# Patient Record
Sex: Male | Born: 1980 | Race: White | Hispanic: No | Marital: Single | Smoking: Never smoker
Health system: Southern US, Academic
[De-identification: ages and names within clinical notes are randomized; demographics above are authoritative.]

## PROBLEM LIST (undated history)

## (undated) DIAGNOSIS — F308 Other manic episodes: Secondary | ICD-10-CM

## (undated) DIAGNOSIS — I10 Essential (primary) hypertension: Secondary | ICD-10-CM

## (undated) DIAGNOSIS — F419 Anxiety disorder, unspecified: Secondary | ICD-10-CM

## (undated) DIAGNOSIS — F32A Depression, unspecified: Secondary | ICD-10-CM

---

## 2022-02-12 ENCOUNTER — Emergency Department
Admission: EM | Admit: 2022-02-12 | Discharge: 2022-02-12 | Disposition: A | Discharge status: Home / Self Care | Payer: No Typology Code available for payment source | Attending: Student in an Organized Health Care Education/Training Program | Admitting: Student in an Organized Health Care Education/Training Program

## 2022-02-12 ENCOUNTER — Encounter (HOSPITAL_COMMUNITY): Payer: Self-pay

## 2022-02-12 ENCOUNTER — Other Ambulatory Visit: Payer: Self-pay

## 2022-02-12 DIAGNOSIS — Y92512 Supermarket, store or market as the place of occurrence of the external cause: Secondary | ICD-10-CM | POA: Insufficient documentation

## 2022-02-12 DIAGNOSIS — M545 Low back pain, unspecified: Secondary | ICD-10-CM | POA: Insufficient documentation

## 2022-02-12 DIAGNOSIS — X500XXA Overexertion from strenuous movement or load, initial encounter: Secondary | ICD-10-CM | POA: Insufficient documentation

## 2022-02-12 HISTORY — DX: Essential (primary) hypertension: I10

## 2022-02-12 HISTORY — DX: Anxiety disorder, unspecified: F41.9

## 2022-02-12 MED ORDER — LIDOCAINE 4 % TOPICAL PATCH
1.0000 | MEDICATED_PATCH | Freq: Every day | CUTANEOUS | 0 refills | Status: AC
Start: 2022-02-12 — End: 2022-02-19

## 2022-02-12 NOTE — ED Provider Notes (Signed)
[facility]  ED Primary Provider Note  History of Present Illness   Chief Complaint   Patient presents with    Back Injury     Cody Mccoy is a 41 y.o. male who had concerns including Back Injury.  Arrival: The patient arrived by Car    Patient is a 41 year old male arrives today via POV with mom at bedside complaining of lower back pain secondary to lifting bottle water at work.  Patient reports he works at [employer] on 02/04/2022 he was lifting a case of bottled water and when he picked up he twist and felt a "snap pop".  In his back.  Patient reports pain has worsened since initial incident.  Patient states he is risk x-rays that showed no acute findings.  Patient was recently started on prednisone a day and a half ago per patient patient states he is tried ibuprofen with no relief.  Patient states heating pad makes it.  Patient states [facility] symptom to the emergency department for MRI.  Patient does have a active referral at this time for physical therapy.  Patient has not follow up with his PCP since initial incident.  Patient is ambulatory denies saddle anesthesia numbness tingling lower extremities urinary incontinence urinary retention.      History Reviewed This Encounter:      Physical Exam   ED Triage Vitals   BP (Non-Invasive) 02/12/22 1015 (!) 170/84   Heart Rate 02/12/22 1015 86   Respiratory Rate 02/12/22 1015 20   Temperature 02/12/22 1241 36.8 C (98.2 F)   SpO2 02/12/22 1015 100 %   Weight 02/12/22 1015 112 kg (247 lb)   Height 02/12/22 1015 1.803 m (5\' 11" )     Physical Exam  Constitutional:       Appearance: Normal appearance. He is obese.   HENT:      Head: Normocephalic and atraumatic.   Cardiovascular:      Rate and Rhythm: Normal rate and regular rhythm.      Pulses: Normal pulses.      Heart sounds: Normal heart sounds.   Pulmonary:      Effort: Pulmonary effort is normal.   Abdominal:      General: Abdomen is flat.      Palpations: Abdomen is soft.    Musculoskeletal:         General: Normal range of motion.      Cervical back: Normal.      Thoracic back: Normal.      Lumbar back: Spasms and tenderness present. No swelling, edema, deformity, signs of trauma, lacerations or bony tenderness. Normal range of motion. Negative right straight leg raise test and negative left straight leg raise test. No scoliosis.   Skin:     General: Skin is warm.      Capillary Refill: Capillary refill takes less than 2 seconds.   Neurological:      General: No focal deficit present.      Mental Status: He is alert and oriented to person, place, and time. Mental status is at baseline.      Motor: No weakness.      Coordination: Coordination normal.      Gait: Gait normal.   Psychiatric:         Mood and Affect: Mood normal.         Behavior: Behavior normal.         Thought Content: Thought content normal.  Judgment: Judgment normal.       Patient Data   Labs Ordered/Reviewed - No data to display  No orders to display     Medical Decision Making        Medical Decision Making  No repeat x-rays were obtained since patient does have x-rays previously done.  Patient does have referral for physical therapy at this time.  Patient is on prednisone currently.  Prescription was given for lidocaine patches for patient patient was advised to continue with referral to physical therapy and following up with primary care provider further evaluation.  Patient advised symptomatic treatment at home.  All questions and concerns addressed.  Strict ED return precautions were given.  Patient was in agreement to the treatment care provided today.  Patient ambulatory with no change in gait upon discharge.                Clinical Impression   Lower back pain (Primary)       Disposition: Discharged

## 2022-02-12 NOTE — ED Triage Notes (Signed)
Pt presents with back injury from work. Picking up a case of water and heard "snap pop".

## 2022-02-12 NOTE — Discharge Instructions (Signed)
Follow up with your primary care provider for close ED follow-up.  Follow up with your referral for physical therapy.  Take medication the prednisone as previously prescribed.  Wear lidocaine patches as indicated as needed for pain management.  Take Tylenol and/or Motrin as needed for pain.  Biofreeze icy hot or Ben-Gay may provide relief.  Return emergency department for new or worsening symptoms that concern you.

## 2022-02-12 NOTE — ED Nurses Note (Signed)
Patient discharged home with family.  AVS reviewed with patient/care giver.  A written copy of the AVS and discharge instructions was given to the patient/care giver.  Questions sufficiently answered as needed.  Patient/care giver encouraged to follow up with PCP as indicated.  In the event of an emergency, patient/care giver instructed to call 911 or go to the nearest emergency room.

## 2022-03-01 IMAGING — MR MRI LUMBAR SPINE WITHOUT CONTRAST
5 of 6 series · 32 of 48 positions shown · IV contrast (gadolinium)
Comparison: None available.

﻿EXAM:  92262   MRI LUMBAR SPINE WITHOUT CONTRAST
INDICATION: 41-year-old male with history of persistent low back pain. Patient sustained injury lifting heavy object in September. Pain radiating to left hip and lower extremity. No history of back surgery.
TECHNIQUE: Multiplanar, multisequential MRI of the lumbosacral spine was performed without gadolinium contrast.

[Series 5: T2 · sagittal · 4.0mm · 0.94mm/px · 6 of 13 slices shown (1 of 3)]
[im 1/13]
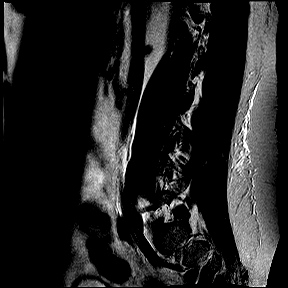
[im 3/13]
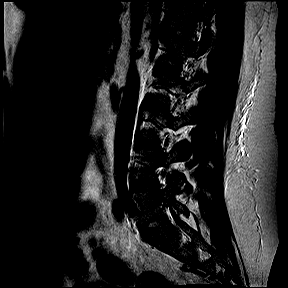
[im 5/13]
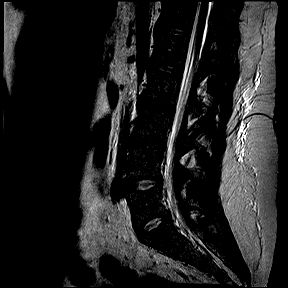
[im 8/13]
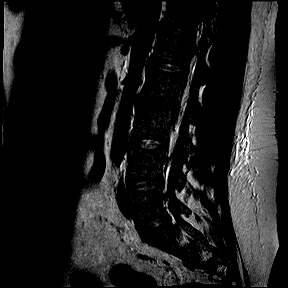
[im 10/13]
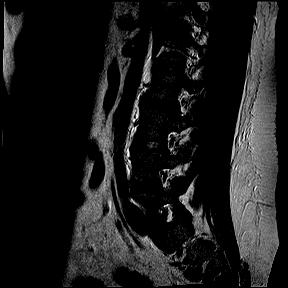
[im 13/13]
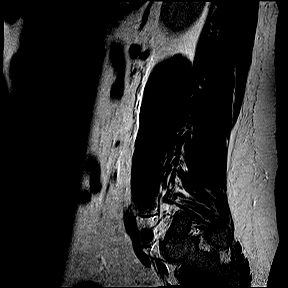

[Series 6: T1 · sagittal · 4.0mm · 0.94mm/px · 6 of 13 slices shown (1 of 2)]
[im 1/13]
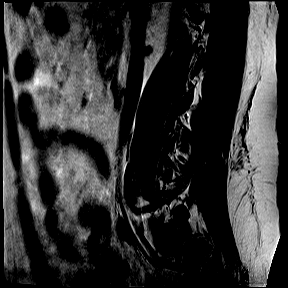
[im 3/13]
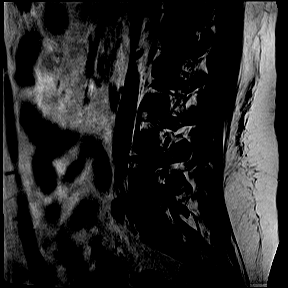
[im 5/13]
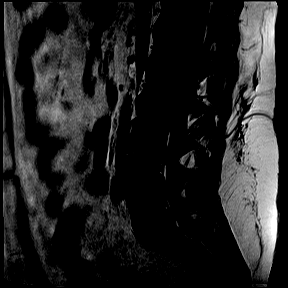
[im 8/13]
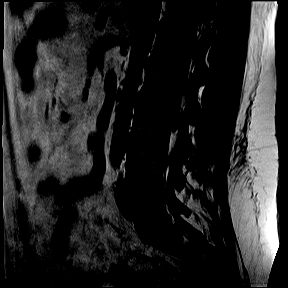
[im 10/13]
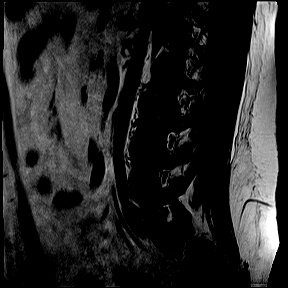
[im 13/13]
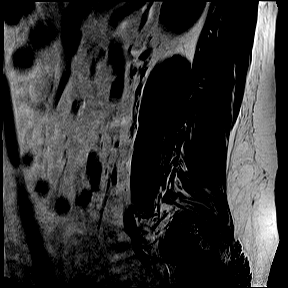

[Series 8: T2 · coronal · 5.0mm · 0.82mm/px · 8 of 18 slices shown (2 of 3)]
[im 1/18]
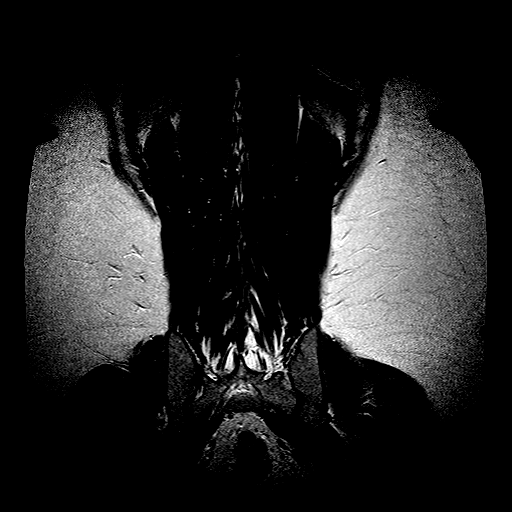
[im 3/18]
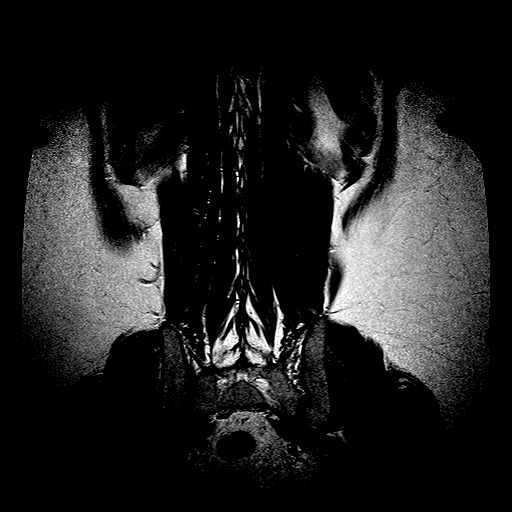
[im 5/18]
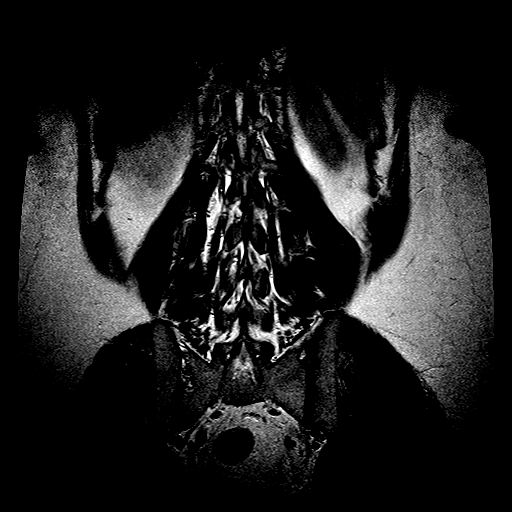
[im 8/18]
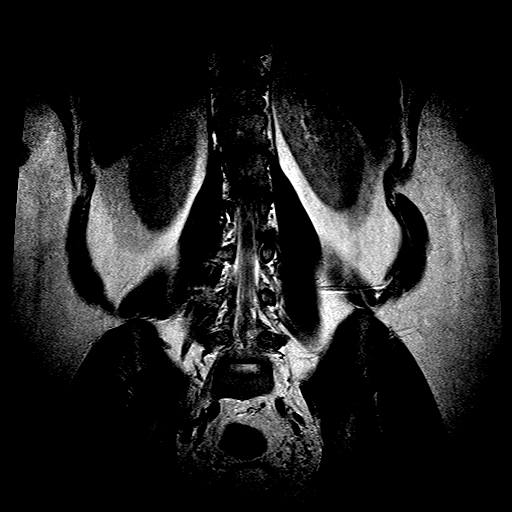
[im 10/18]
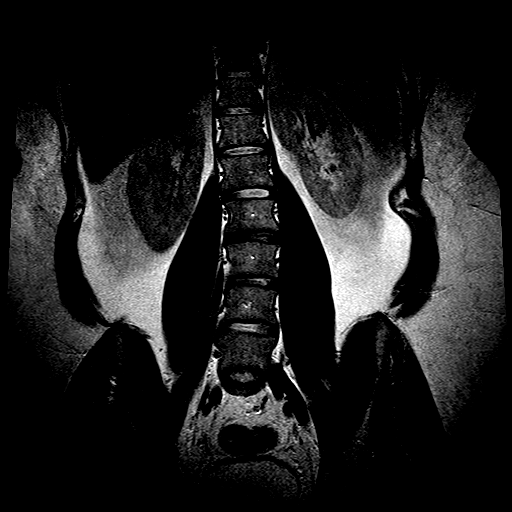
[im 13/18]
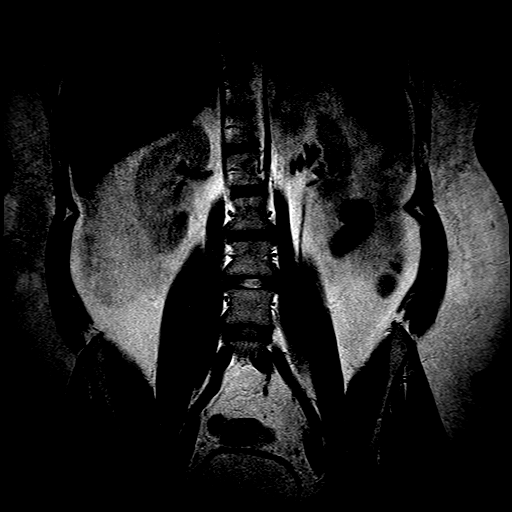
[im 15/18]
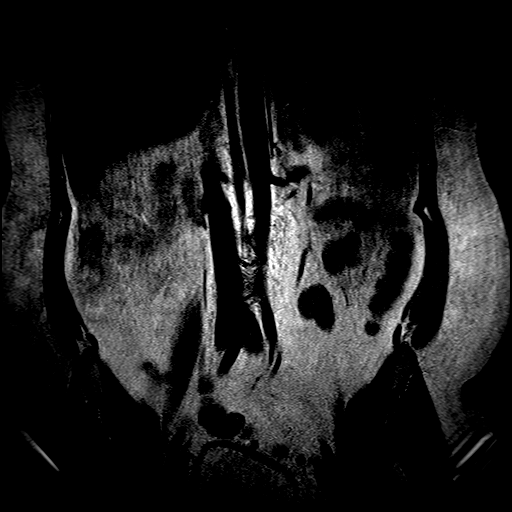
[im 18/18]
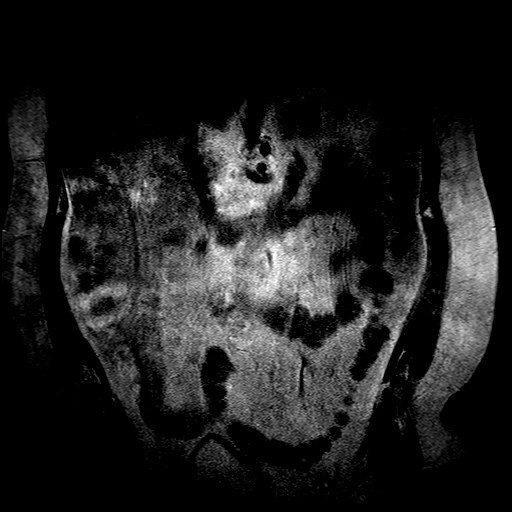

[Series 9: T2 · axial · 4.0mm · 0.52mm/px · z∈[-139,+78]mm · 11 of 23 slices shown (3 of 3)]
[im 1/23]
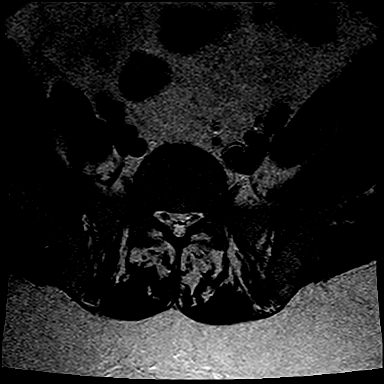
[im 3/23]
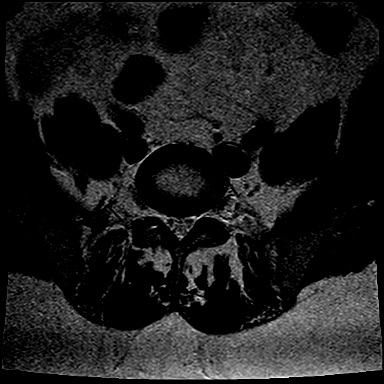
[im 5/23]
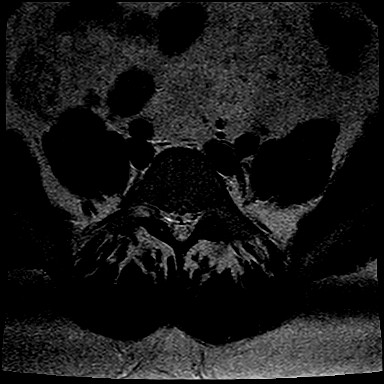
[im 7/23]
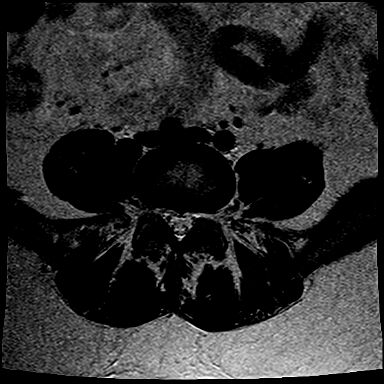
[im 9/23]
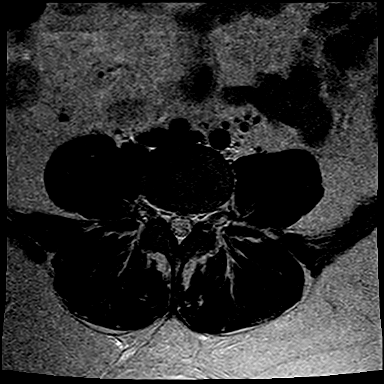
[im 12/23]
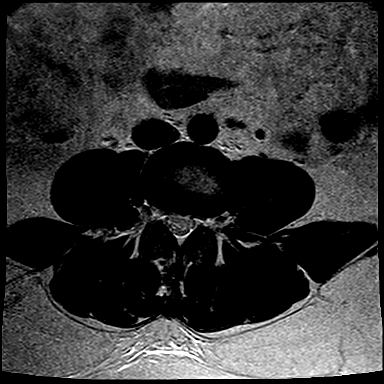
[im 14/23]
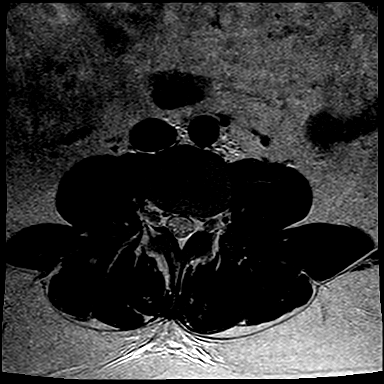
[im 16/23]
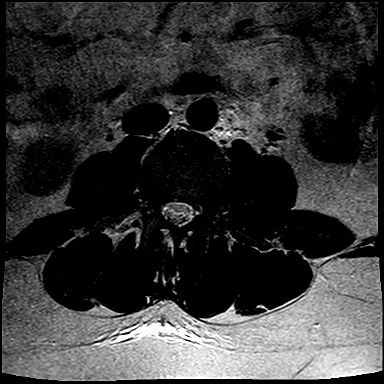
[im 18/23]
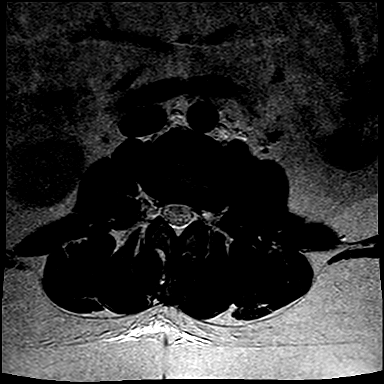
[im 20/23]
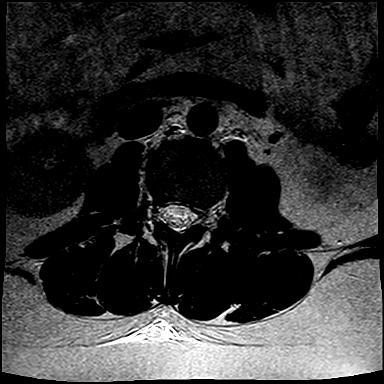
[im 23/23]
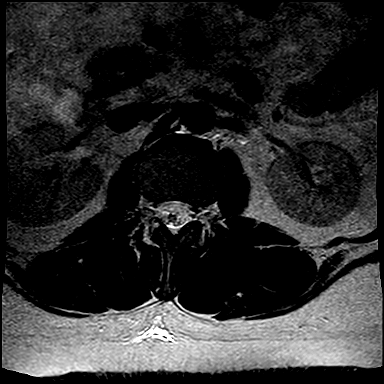

[Series 10: T1 · axial · 4.0mm · 0.52mm/px · 1 of 23 slices shown (2 of 2)]
[im 1/23]
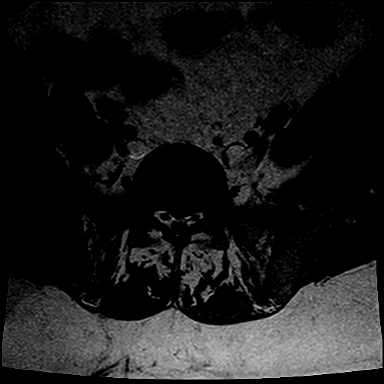

[32 of 48 positions shown; findings below may reference images not displayed]

FINDINGS: No acute or focal bone changes of lumbar vertebrae are seen. 

Lower spinal cord and cauda equina are normal.

At L1-2 level, no focal disc lesions are seen. 

At L2-3 level, mild degenerative disc changes are noted with bulging annulus and facet arthropathy causing mild compromise of left neural foramen.

At L3-4 level, no focal disc lesions are seen. 

At L4-5 level, bilateral facet arthropathy is causing moderate compromise of both lateral recesses.

At L5-S1 level, bilateral facet arthropathy is causing moderate compromise of left neural foramen. 

Paravertebral soft tissues are unremarkable.
IMPRESSION: 1. No focal or acute bone changes of lumbar vertebrae.

2. At L4-5 level, bilateral facet arthropathy is causing moderate compromise of both lateral recesses.

3. At L5-S1 level, bilateral facet arthropathy is causing moderate compromise of left neural foramen. 

4. Findings at other disc levels are described above in detail.

5. Overall, findings described above are chronic in nature.

## 2022-09-02 IMAGING — MR MRI CERVICAL SPINE WITHOUT CONTRAST
4 of 5 series · 23 of 48 positions shown · non-contrast
Comparison: None previous.

﻿EXAM:  [id number]   MRI CERVICAL SPINE WITHOUT CONTRAST
INDICATION: 42-year-old with neck pain radiating to both shoulders.  Sustained lifting injury lifting heavy object.  No history of cervical spine surgery.
TECHNIQUE: Coronal, sagittal and axial images as per protocol.  Some of the images in the upper cervical spine area were compromised due to artifacts produced by metallic object in the dental work.  Overall quality is acceptable.

[Series 5: T2 · sagittal · 3.0mm · 0.75mm/px · 8 of 13 slices shown (1 of 2)]
[im 1/13]
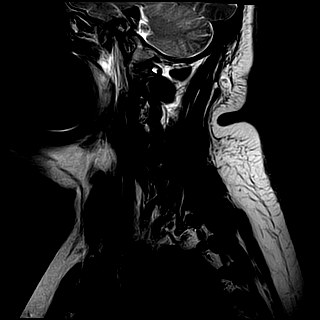
[im 2/13]
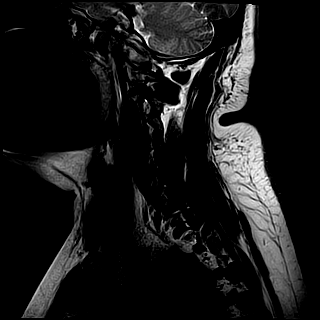
[im 4/13]
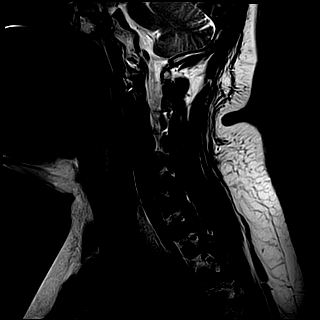
[im 6/13]
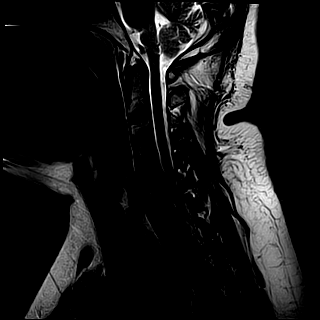
[im 7/13]
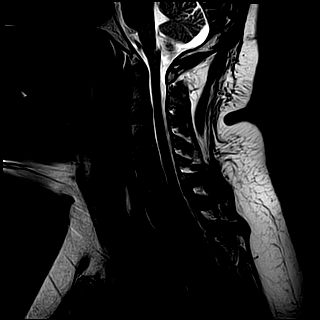
[im 9/13]
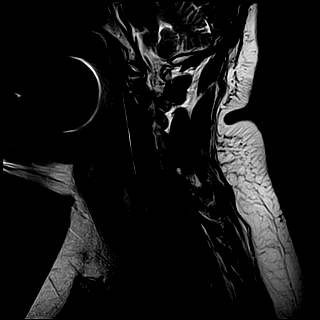
[im 11/13]
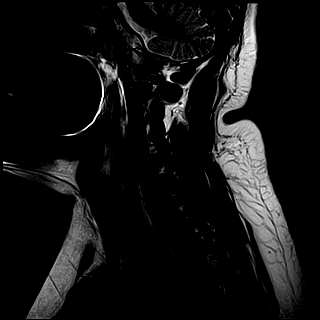
[im 13/13]
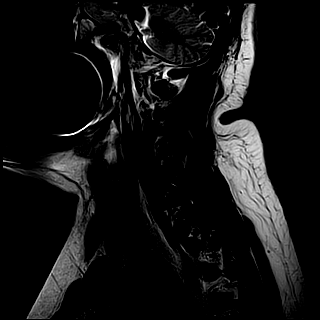

[Series 6: T1 · sagittal · 3.0mm · 0.47mm/px · 3 of 13 slices shown]
[im 2/13]
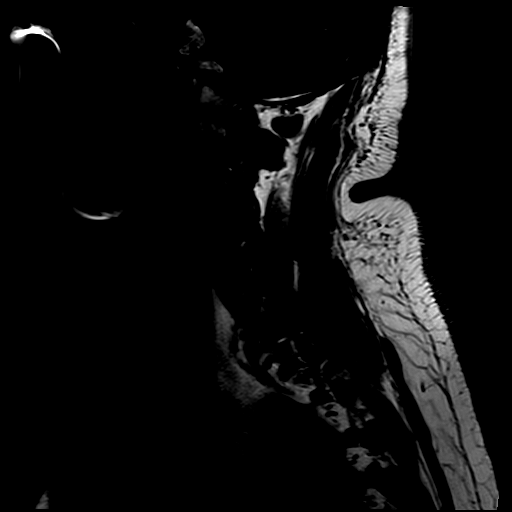
[im 7/13]
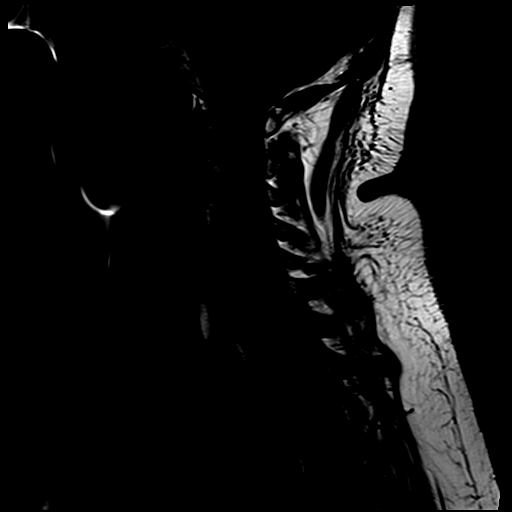
[im 11/13]
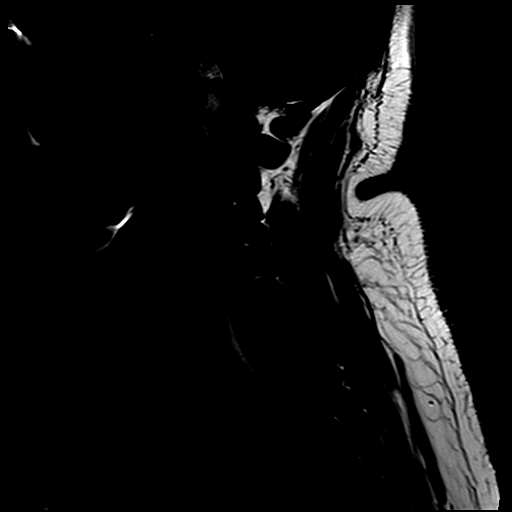

[Series 7: STIR · sagittal · 3.0mm · 0.47mm/px · 3 of 13 slices shown]
[im 2/13]
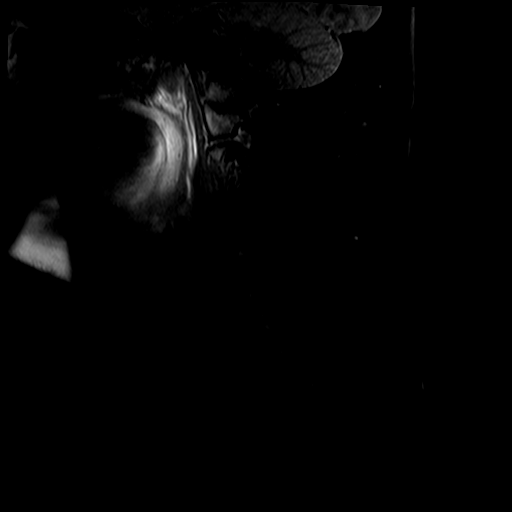
[im 7/13]
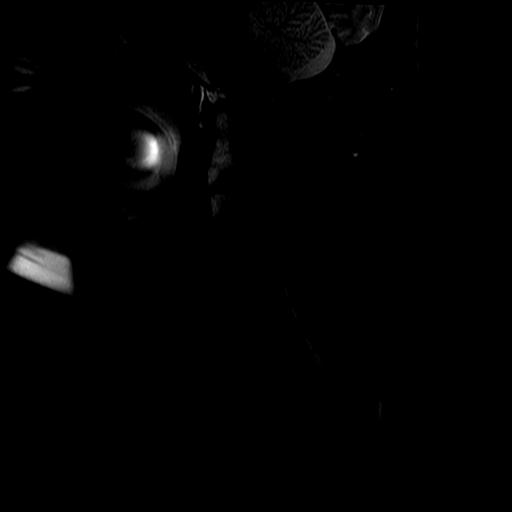
[im 11/13]
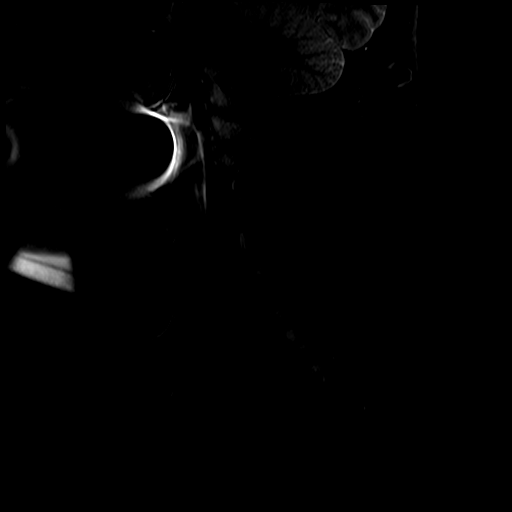

[Series 8: T2 · axial · 3.0mm · 0.39mm/px · z∈[-74,+24]mm · 9 of 18 slices shown (2 of 2)]
[im 1/18]
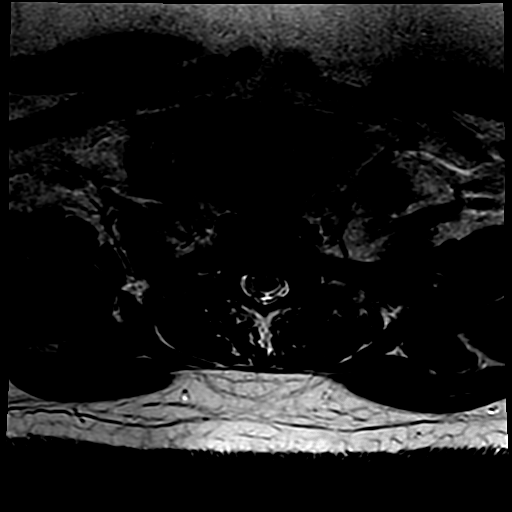
[im 4/18]
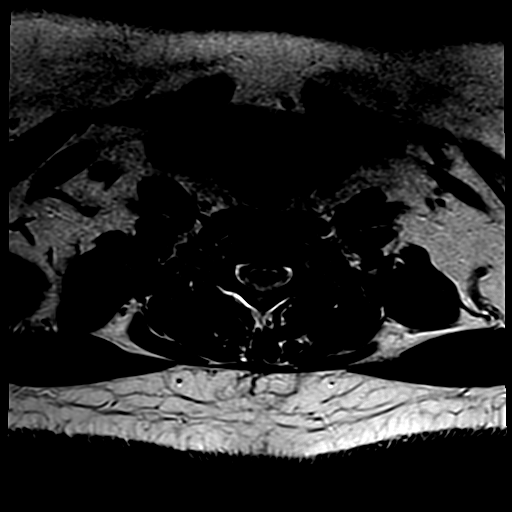
[im 5/18]
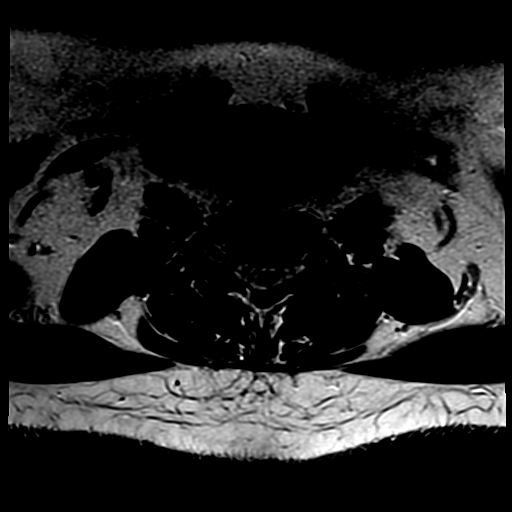
[im 8/18]
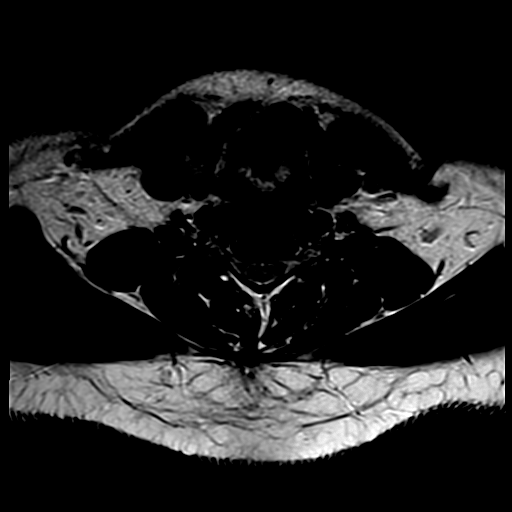
[im 10/18]
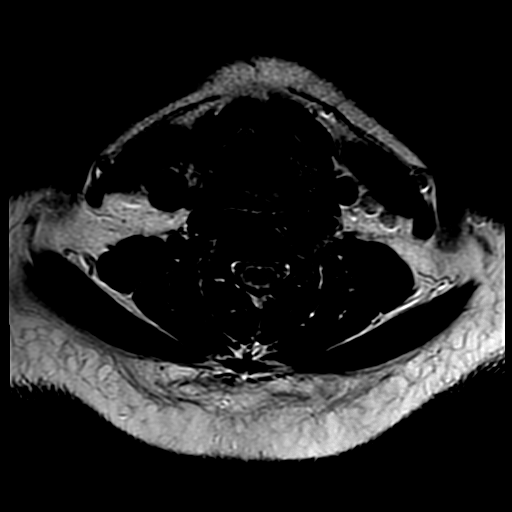
[im 13/18]
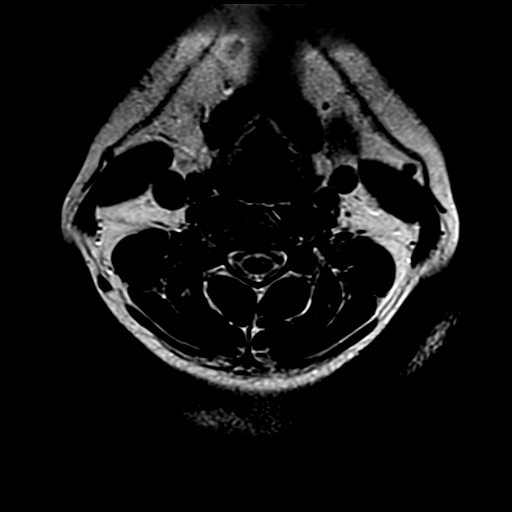
[im 14/18]
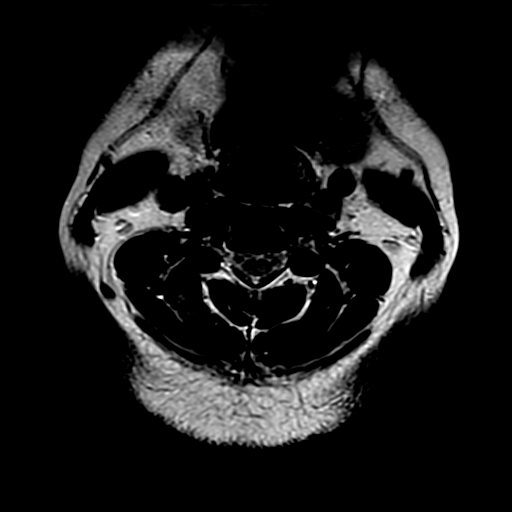
[im 16/18]
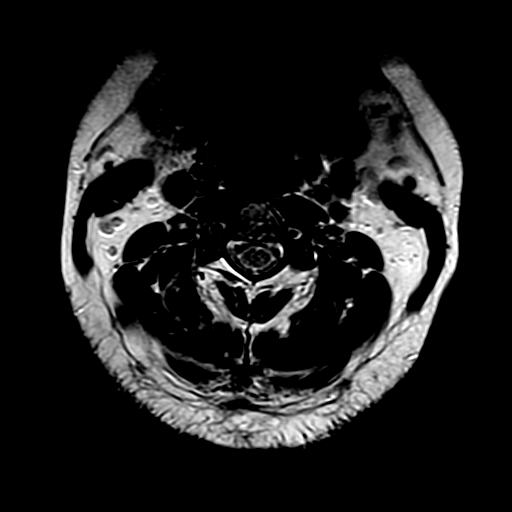
[im 18/18]
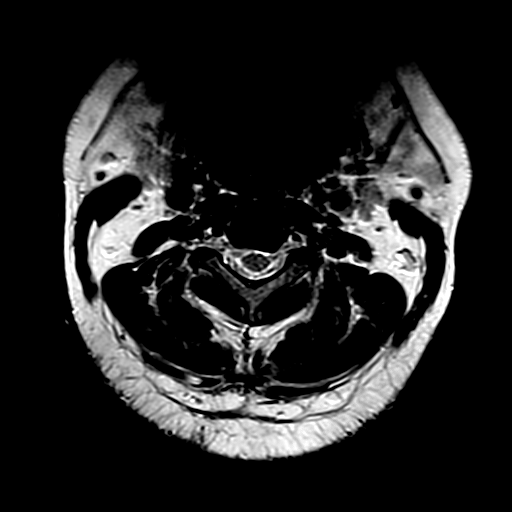

[23 of 48 positions shown; findings below may reference images not displayed]

FINDINGS: No acute bony lesions of the cervical vertebrae.  Posterior fossa and foramen magnum structures are normal in the sagittal projection. 

At C2-C3 level, small left paracentral disc protrusion is minimally compromising the left lateral recess.  

At C3-C4 level, no focal disc lesions are seen. 

At C4-C5 level, no focal disc lesions are seen. 

At C5-C6 level, mild bulging annulus due to degenerative disc changes causing minimal compromise of the thecal sac and right neural foramen.  

At C6-C7 level, mild bulging annulus is noted causing minimal compromise of both neural foramina.  

C7-T1 disc shows asymmetric bulging annulus to the left causing mild left foraminal narrowing.  

Cervical spinal cord shows no focal lesions.
IMPRESSION: 1. No acute or focal bone changes of cervical vertebrae. 

2. Mild degenerative changes are noted at multiple levels as described above in detail.  Overall, findings appear to be of chronic nature.  

3. Cervical spinal cord shows no focal pathology.

## 2023-01-20 ENCOUNTER — Encounter (HOSPITAL_COMMUNITY): Payer: Self-pay

## 2023-01-20 ENCOUNTER — Emergency Department
Admission: EM | Admit: 2023-01-20 | Discharge: 2023-01-23 | Disposition: A | Discharge status: Other Acute Inpatient Hospital | Payer: Medicaid Other

## 2023-01-20 ENCOUNTER — Other Ambulatory Visit: Payer: Self-pay

## 2023-01-20 DIAGNOSIS — R45851 Suicidal ideations: Secondary | ICD-10-CM | POA: Insufficient documentation

## 2023-01-20 DIAGNOSIS — R45850 Homicidal ideations: Secondary | ICD-10-CM | POA: Insufficient documentation

## 2023-01-20 DIAGNOSIS — F411 Generalized anxiety disorder: Secondary | ICD-10-CM | POA: Insufficient documentation

## 2023-01-20 HISTORY — DX: Other manic episodes: F30.8

## 2023-01-20 HISTORY — DX: Depression, unspecified: F32.A

## 2023-01-20 LAB — URINALYSIS, MACROSCOPIC
BILIRUBIN: NEGATIVE mg/dL
BLOOD: NEGATIVE mg/dL
GLUCOSE: NEGATIVE mg/dL
KETONES: NEGATIVE mg/dL
LEUKOCYTES: NEGATIVE WBCs/uL
NITRITE: NEGATIVE
PH: 5.5 (ref 5.0–9.0)
PROTEIN: 10 mg/dL
SPECIFIC GRAVITY: 1.028 (ref 1.002–1.030)
UROBILINOGEN: NORMAL mg/dL

## 2023-01-20 LAB — COMPREHENSIVE METABOLIC PANEL, NON-FASTING
ALBUMIN/GLOBULIN RATIO: 1.4 (ref 0.8–1.4)
ALBUMIN: 4.6 g/dL (ref 3.5–5.7)
ALKALINE PHOSPHATASE: 134 U/L — ABNORMAL HIGH (ref 34–104)
ALT (SGPT): 18 U/L (ref 7–52)
ANION GAP: 7 mmol/L (ref 4–13)
AST (SGOT): 17 U/L (ref 13–39)
BILIRUBIN TOTAL: 0.3 mg/dL (ref 0.3–1.0)
BUN/CREA RATIO: 15 (ref 6–22)
BUN: 19 mg/dL (ref 7–25)
CALCIUM, CORRECTED: 9.4 mg/dL (ref 8.9–10.8)
CALCIUM: 9.9 mg/dL (ref 8.6–10.3)
CHLORIDE: 106 mmol/L (ref 98–107)
CO2 TOTAL: 26 mmol/L (ref 21–31)
CREATININE: 1.25 mg/dL (ref 0.60–1.30)
ESTIMATED GFR: 74 mL/min/{1.73_m2} (ref >59)
GLOBULIN: 3.4 (ref 2.9–5.4)
GLUCOSE: 102 mg/dL (ref 74–109)
OSMOLALITY, CALCULATED: 280 mosm/kg (ref 270–290)
POTASSIUM: 4.2 mmol/L (ref 3.5–5.1)
PROTEIN TOTAL: 8 g/dL (ref 6.4–8.9)
SODIUM: 139 mmol/L (ref 136–145)

## 2023-01-20 LAB — URINALYSIS, MICROSCOPIC
RBCS: 1 /[HPF] (ref <4)
SQUAMOUS EPITHELIAL: 1 /[HPF] (ref <28)

## 2023-01-20 LAB — CBC WITH DIFF
BASOPHIL #: 0 10*3/uL (ref 0.00–0.10)
BASOPHIL %: 0 % (ref 0–1)
EOSINOPHIL #: 0.7 10*3/uL — ABNORMAL HIGH (ref 0.00–0.50)
EOSINOPHIL %: 8 % (ref 1–8)
HCT: 48.5 % — ABNORMAL HIGH (ref 36.7–47.1)
HGB: 16.1 g/dL (ref 12.5–16.3)
LYMPHOCYTE #: 2.2 10*3/uL (ref 1.00–3.00)
LYMPHOCYTE %: 26 % (ref 16–44)
MCH: 27.9 pg (ref 23.8–33.4)
MCHC: 33.2 g/dL (ref 32.5–36.3)
MCV: 84 fL (ref 73.0–96.2)
MONOCYTE #: 0.6 10*3/uL (ref 0.30–1.00)
MONOCYTE %: 7 % (ref 5–13)
MPV: 10.3 fL (ref 7.4–11.4)
NEUTROPHIL #: 5.3 10*3/uL (ref 1.85–7.80)
NEUTROPHIL %: 60 % (ref 43–77)
PLATELETS: 220 10*3/uL (ref 140–440)
RBC: 5.77 10*6/uL — ABNORMAL HIGH (ref 4.06–5.63)
RDW: 14.6 % (ref 12.1–16.2)
WBC: 8.7 10*3/uL (ref 3.6–10.2)

## 2023-01-20 LAB — DRUG SCREEN, NO CONFIRMATION, URINE
AMPHETAMINES URINE: NEGATIVE
BARBITURATES URINE: NEGATIVE
BENZODIAZEPINES URINE: NEGATIVE
BUPRENORPHINE URINE: NEGATIVE
CANNABINOIDS URINE: NEGATIVE
COCAINE METABOLITES URINE: NEGATIVE
FENTANYL, URINE: NEGATIVE
METHADONE URINE: NEGATIVE
OPIATES URINE: NEGATIVE
OXYCODONE URINE: NEGATIVE
PCP URINE: NEGATIVE

## 2023-01-20 LAB — SALICYLATE ACID LEVEL: SALICYLATE LEVEL: 5 mg/dL (ref <=30)

## 2023-01-20 LAB — ETHANOL, SERUM/PLASMA: ETHANOL: <10 mg/dL

## 2023-01-20 LAB — ACETAMINOPHEN LEVEL: ACETAMINOPHEN LEVEL: <10 ug/mL — ABNORMAL LOW (ref 10–30)

## 2023-01-20 LAB — THYROID STIMULATING HORMONE WITH FREE T4 REFLEX: TSH: 1.704 u[IU]/mL (ref 0.450–5.330)

## 2023-01-20 MED ORDER — METOPROLOL TARTRATE 50 MG TABLET
ORAL_TABLET | ORAL | Status: AC
Start: 2023-01-20 — End: 2023-01-20
  Filled 2023-01-20: qty 1

## 2023-01-20 MED ORDER — QUETIAPINE 100 MG TABLET
200.0000 mg | ORAL_TABLET | Freq: Two times a day (BID) | ORAL | Status: DC
Start: 2023-01-20 — End: 2023-01-23
  Administered 2023-01-20 – 2023-01-23 (×6): 200 mg via ORAL

## 2023-01-20 MED ORDER — LORAZEPAM 0.5 MG TABLET
0.5000 mg | ORAL_TABLET | Freq: Two times a day (BID) | ORAL | Status: DC
Start: 2023-01-20 — End: 2023-01-23
  Administered 2023-01-20 – 2023-01-23 (×6): 0.5 mg via ORAL

## 2023-01-20 MED ORDER — VENLAFAXINE ER 75 MG CAPSULE,EXTENDED RELEASE 24 HR
225.0000 mg | ORAL_CAPSULE | Freq: Every day | ORAL | Status: DC
Start: 2023-01-21 — End: 2023-01-23
  Administered 2023-01-21 – 2023-01-23 (×3): 225 mg via ORAL
  Filled 2023-01-20 (×8): qty 1

## 2023-01-20 MED ORDER — METOPROLOL TARTRATE 50 MG TABLET
50.0000 mg | ORAL_TABLET | Freq: Two times a day (BID) | ORAL | Status: DC
Start: 2023-01-20 — End: 2023-01-23
  Administered 2023-01-20 – 2023-01-23 (×6): 50 mg via ORAL

## 2023-01-20 MED ORDER — LORAZEPAM 0.5 MG TABLET
ORAL_TABLET | ORAL | Status: AC
Start: 2023-01-20 — End: 2023-01-20
  Filled 2023-01-20: qty 1

## 2023-01-20 MED ORDER — BUSPIRONE 10 MG TABLET
30.0000 mg | ORAL_TABLET | Freq: Every day | ORAL | Status: DC
Start: 2023-01-21 — End: 2023-01-23
  Administered 2023-01-21 – 2023-01-23 (×3): 30 mg via ORAL
  Filled 2023-01-20 (×2): qty 3
  Filled 2023-01-20: qty 6
  Filled 2023-01-20 (×6): qty 3

## 2023-01-20 MED ORDER — LAMOTRIGINE 100 MG TABLET
200.0000 mg | ORAL_TABLET | Freq: Two times a day (BID) | ORAL | Status: DC
Start: 2023-01-20 — End: 2023-01-23
  Administered 2023-01-20 – 2023-01-23 (×6): 200 mg via ORAL
  Filled 2023-01-20 (×14): qty 2

## 2023-01-20 MED ORDER — QUETIAPINE 100 MG TABLET
ORAL_TABLET | ORAL | Status: AC
Start: 2023-01-20 — End: 2023-01-20
  Filled 2023-01-20: qty 2

## 2023-01-20 NOTE — ED Nurses Note (Signed)
Pt assigned to ED 16 with c/o suicidal thoughts with no specific plan and homicidal thoughts "at times" towards his dad (at bedside) "we get into it sometimes". PT is pleasant but presents anxious with tearful outburst at times.  Reports "years" of depression with manic episodes and SI thoughts with plans but "something stopped me". This episode has been building for 3 weeks with starting in June when he was fired from job. Plans included shooting self had gun to head but stopped and overdose on personal medications. Intrusive thoughts of "drinking til I'm drunk not just a drink and I dont drink". Pt Pt is agreeable to go for inpatient treatment.

## 2023-01-20 NOTE — ED Triage Notes (Signed)
Depression with no plan  states just thoughts. Hx bipolar

## 2023-01-20 NOTE — ED Nurses Note (Signed)
Pt currently resting quietly in bed. AAOx3. Pleasant demeanor, responds appropriately to verbal stimuli. Compliant w/ medication protocol. 1:1 in effect, as well as video monitoring.

## 2023-01-20 NOTE — ED Provider Notes (Signed)
[facility]  ED Primary Provider Note  Patient Name: Cody Mccoy  Patient Age: 42 y.o.  Date of Birth: January 06, 1981    Chief Complaint: Suicidal Thoughts         History of Present Illness       Cody Mccoy is a 42 y.o. male who had concerns including Suicidal Thoughts .  Patient presents with suicidal ideation and homicidal ideation with no specific plan.  He states that he has had several attempts in the past that have been unsuccessful.  Patient is isn't put presents anxious with tearful outbursts at times.  He reports years of depression with manic episodes and SI thoughts with plans but states that something always stopped him.  The current episode has been progressively building over the past 3 weeks.  He also reports being fired from his job.  Previous plans included shooting himself with a gun to the head or overdose on personal medications.  He reports intrusive thoughts of drinking until he is drunk and not just drinking to have a social drink.  Patient was requesting inpatient treatment for further evaluation.  He also states that he has been on multiple psychiatric medications of which she is maxed out on several max dosages at the moment and believes that maybe he needs a change in medication or dosage once more.  Patient denies any pain nausea vomiting shortness of breath headache fever chills changes in bowel habits painful urination.      History provided by:  Patient and relative       Review of Systems     No other overt Review of Systems are noted to be positive except noted in the HPI.      Historical Data   History Reviewed This Encounter:        Physical Exam   ED Triage Vitals [01/20/23 1340]   BP (Non-Invasive) (!) 161/89   Heart Rate 94   Respiratory Rate 20   Temperature 37.1 C (98.8 F)   SpO2 99 %   Weight (!) 141 kg (310 lb)   Height 1.829 m (6')       Physical Exam  Constitutional:       Appearance: Normal appearance. He is normal weight.   HENT:       Head: Normocephalic and atraumatic.      Mouth/Throat:      Mouth: Mucous membranes are moist.      Pharynx: Oropharynx is clear.   Eyes:      Pupils: Pupils are equal, round, and reactive to light.   Cardiovascular:      Rate and Rhythm: Normal rate and regular rhythm.      Pulses: Normal pulses.      Heart sounds: Normal heart sounds.   Pulmonary:      Effort: Pulmonary effort is normal.      Breath sounds: Normal breath sounds.   Abdominal:      General: Abdomen is flat. Bowel sounds are normal.      Palpations: Abdomen is soft.   Musculoskeletal:         General: Normal range of motion.      Cervical back: Normal range of motion and neck supple.   Skin:     General: Skin is warm and dry.      Capillary Refill: Capillary refill takes less than 2 seconds.   Neurological:      Mental Status: He is alert and oriented to person, place, and  time.   Psychiatric:         Mood and Affect: Mood normal.         Behavior: Behavior normal.            Patient Data     Labs Ordered/Reviewed   COMPREHENSIVE METABOLIC PANEL, NON-FASTING - Abnormal; Notable for the following components:       Result Value    ALKALINE PHOSPHATASE 134 (*)     All other components within normal limits    Narrative:     Estimated Glomerular Filtration Rate (eGFR) is calculated using the CKD-EPI (2021) equation, intended for patients 55 years of age and older. If gender is not documented or "unknown", there will be no eGFR calculation.     ACETAMINOPHEN LEVEL - Abnormal; Notable for the following components:    ACETAMINOPHEN LEVEL <10 (*)     All other components within normal limits   CBC WITH DIFF - Abnormal; Notable for the following components:    RBC 5.77 (*)     HCT 48.5 (*)     EOSINOPHIL # 0.70 (*)     All other components within normal limits   ETHANOL, SERUM/PLASMA - Normal   THYROID STIMULATING HORMONE WITH FREE T4 REFLEX - Normal   DRUG SCREEN, NO CONFIRMATION, URINE - Normal    Narrative:     Any results reported as "positive" on this  urine drug screen are unconfirmed screening results and should be used for medical(i.e.,treatment)purposes only. Unconfirmed screening results must not be used for non-medical purposes (e.g. employment or legal testing). Upon request, all results reported as "positive" can be sent to a reference laboratory for confirmation by GCMS.     Reporting Limits (cut-off concentrations)     Cocaine 300 ng/mL  Opiates 300 ng/mL  THC 50 ng/mL  Amphetamine 1000 ng/mL  Phencyclidine 25 ng/mL  Benzodiazepine 300 ng/mL  Barbiturates 300 ng/mL  Methadone 300 ng/mL  Oxycodone 100 ng/mL  Buprenorphine 5 ng/mL  Fentanyl 5 ng/mL     SALICYLATE ACID LEVEL - Normal   URINALYSIS, MACROSCOPIC - Normal   CBC/DIFF    Narrative:     The following orders were created for panel order CBC/DIFF.  Procedure                               Abnormality         Status                     ---------                               -----------         ------                     CBC WITH FAOZ[[id number]]                Abnormal            Final result                 Please view results for these tests on the individual orders.   URINALYSIS, MACROSCOPIC AND MICROSCOPIC W/CULTURE REFLEX    Narrative:     The following orders were created for panel order URINALYSIS, MACROSCOPIC AND MICROSCOPIC W/CULTURE REFLEX.  Procedure  Abnormality         Status                     ---------                               -----------         ------                     URINALYSIS, MACROSCOPIC[[id number]]      Normal              Final result               URINALYSIS, MICROSCOPIC[[id number]]                          Final result                 Please view results for these tests on the individual orders.   URINALYSIS, MICROSCOPIC       No orders to display       Medical Decision Making          Medical Decision Making        Studies Assessed and/or Ordered:  CBC CMP TSH serum ethanol drug screen UA salicylate acid level acetaminophen level      MDM  Narrative:    Generalized anxiety disorder.  Suicidal ideations.  Homicidal ideations.  Patient presents with suicidal ideation and homicidal ideation with no specific plan.  He states that he has had several attempts in the past that have been unsuccessful.  Patient is isn't put presents anxious with tearful outbursts at times.  He reports years of depression with manic episodes and SI thoughts with plans but states that something always stopped him.  The current episode has been progressively building over the past 3 weeks.  He also reports being fired from his job.  Previous plans included shooting himself with a gun to the head or overdose on personal medications.  He reports intrusive thoughts of drinking until he is drunk and not just drinking to have a social drink.  Patient was requesting inpatient treatment for further evaluation.  He also states that he has been on multiple psychiatric medications of which she is maxed out on several max dosages at the moment and believes that maybe he needs a change in medication or dosage once more.  Patient denies any pain nausea vomiting shortness of breath headache fever chills changes in bowel habits painful urination.  CBC shows RBCs 5.77 hemoglobin 16.1 hematocrit 48.5.  CMP unremarkable.  TSH 1.704.  Serum ethanol less than 10.  Urine drug screen negative.  UA shows no evidence of urinary tract infection.  Salicylate acid 5.  Acetaminophen level less than 10.                     Based on the history, physical exam, and tests obtained as indicated, the Emergency Department evaluation has identified no acute or active medical issues that warrant further emergency department intervention or precludes discharge of the patient in the company of law enforcement or transfer to a psychiatric facility.    Disposition: Psych Facility                   Clinical Impression   GAD (generalized anxiety disorder) (Primary)   Suicidal ideations  Homicidal ideations          Current Discharge Medication List            /M. Ardith Test, FNP-BC  Department of Emergency Medicine  [facility]

## 2023-01-20 NOTE — ED APP Handoff Note (Signed)
[facility] - Emergency Department  Emergency Department  Provider in Triage Note    Name: Cody Mccoy  Age: 42 y.o.  Gender: male     Subjective:   Cody Mccoy is a 42 y.o. male who presents with complaint of Suicidal Thoughts   .  Pt with a hx of bipolar, depression, anxiety, PTSD. PT with thoughts of SI and HI for last week. Denies an active plan    Objective:   Filed Vitals:    01/20/23 1340   BP: (!) 161/89   Pulse: 94   Resp: 20   Temp: 37.1 C (98.8 F)   SpO2: 99%      Focused Physical Exam shows adult male alert upright in NAD. Appears slightly anxious    Assessment:  A medical screening exam was completed.  This patient is a 42 y.o. male with initial findings showing depression, SI and HI.    Plan:  Please see initial orders and work-up below.  This is to be continued with full evaluation in the main Emergency Department.     No current facility-administered medications for this encounter.     Results for orders placed or performed during the hospital encounter of 01/20/23 (from the past 24 hour(s))   CBC/DIFF    Narrative    The following orders were created for panel order CBC/DIFF.  Procedure                               Abnormality         Status                     ---------                               -----------         ------                     CBC WITH ZOXW[[id number]]                                                                 Please view results for these tests on the individual orders.   URINALYSIS, MACROSCOPIC AND MICROSCOPIC W/CULTURE REFLEX    Specimen: Urine, Site not specified    Narrative    The following orders were created for panel order URINALYSIS, MACROSCOPIC AND MICROSCOPIC W/CULTURE REFLEX.  Procedure                               Abnormality         Status                     ---------                               -----------         ------                     URINALYSIS, MACROSCOPIC[[id number]]  URINALYSIS, MICROSCOPIC[[id number]]                                                       Please view results for these tests on the individual orders.        Senaida Lange, PA-C  01/20/2023, 13:39

## 2023-01-21 MED ORDER — QUETIAPINE 100 MG TABLET
ORAL_TABLET | ORAL | Status: AC
Start: 2023-01-21 — End: 2023-01-21
  Filled 2023-01-21: qty 2

## 2023-01-21 MED ORDER — METOPROLOL TARTRATE 50 MG TABLET
ORAL_TABLET | ORAL | Status: AC
Start: 2023-01-21 — End: 2023-01-21
  Filled 2023-01-21: qty 1

## 2023-01-21 MED ORDER — IBUPROFEN 400 MG TABLET
600.0000 mg | ORAL_TABLET | ORAL | Status: AC
Start: 2023-01-21 — End: 2023-01-21
  Administered 2023-01-21: 600 mg via ORAL

## 2023-01-21 MED ORDER — IBUPROFEN 400 MG TABLET
ORAL_TABLET | ORAL | Status: AC
Start: 2023-01-21 — End: 2023-01-21
  Filled 2023-01-21: qty 2

## 2023-01-21 MED ORDER — LORAZEPAM 0.5 MG TABLET
ORAL_TABLET | ORAL | Status: AC
Start: 2023-01-21 — End: 2023-01-21
  Filled 2023-01-21: qty 1

## 2023-01-21 MED ORDER — METOPROLOL TARTRATE 25 MG TABLET
ORAL_TABLET | ORAL | Status: AC
Start: 2023-01-21 — End: 2023-01-21
  Filled 2023-01-21: qty 2

## 2023-01-21 NOTE — ED APP Handoff Note (Signed)
[facility] - Emergency Department  Emergency Department  Handoff Note    Care/report received from [facility] NP @ 1800  Per report:  Cody Mccoy is a 42 y.o. male who had concerns including Suicidal Thoughts .     Pending labs/imaging/consults:  [facility] referral  Plan:  Patient accepted bed awaiting pick you bed patient was signed out to oncoming provider    Course:      Disposition pending at the time of sign out. Care of [name] was signed out to [employer] NP @ 0600 following a discussion of the patient's course. Please refer to their Course Note for further details of the patient's ED course.    Clinical Impression   GAD (generalized anxiety disorder) (Primary)   Suicidal ideations   Homicidal ideations         Emi Holes, PA-C

## 2023-01-21 NOTE — ED Nurses Note (Signed)
Pt requests something for back pain d/t pinched nerves and bulging discs. Provider notified and orders placed. Pt is pleasant. No c/o voiced. Lays down and is currently resting quietly w/ 1:1 and video monitoring in effect.

## 2023-01-21 NOTE — ED Nurses Note (Signed)
Pt has rested quietly all evening w/ no c/o voiced or s/s of distress noted. 1:1 and video monitoring in effect.

## 2023-01-21 NOTE — ED Nurses Note (Signed)
Pt sitting on bed in room. Pleasant demeanor. No c/o voiced at this time. Responds appropriately to verbal stimuli. Takes medications as ordered w/ no c/o voiced. 1:1 and video monitoring in effect at this time.

## 2023-01-21 NOTE — ED APP Handoff Note (Signed)
[facility]  Emergency Department  Course Note    Patient Name: Cody Mccoy  Age and Gender: 42 y.o. male  Date of Birth: 07/08/1980  Date of Service: 01/20/2023  MRN: [id number]  PCP: No Pcp    After a thorough discussion of the patient I have assumed care of Cody Mccoy from Mitchell McDuffee at 06:00.  Patient resting quietly in his room, respiratory distress noted.  Patient is awaiting placement at [facility] behavioral health.    VS:  Temperature: 36.6 C (97.9 F)  Heart Rate: 95  Respiratory Rate: 20  BP (Non-Invasive): (!) 161/108  SpO2: 96 %    DIAGNOSTICS  Labs Ordered/Reviewed   COMPREHENSIVE METABOLIC PANEL, NON-FASTING - Abnormal; Notable for the following components:       Result Value    ALKALINE PHOSPHATASE 134 (*)     All other components within normal limits    Narrative:     Estimated Glomerular Filtration Rate (eGFR) is calculated using the CKD-EPI (2021) equation, intended for patients 27 years of age and older. If gender is not documented or "unknown", there will be no eGFR calculation.     ACETAMINOPHEN LEVEL - Abnormal; Notable for the following components:    ACETAMINOPHEN LEVEL <10 (*)     All other components within normal limits   CBC WITH DIFF - Abnormal; Notable for the following components:    RBC 5.77 (*)     HCT 48.5 (*)     EOSINOPHIL # 0.70 (*)     All other components within normal limits   ETHANOL, SERUM/PLASMA - Normal   THYROID STIMULATING HORMONE WITH FREE T4 REFLEX - Normal   DRUG SCREEN, NO CONFIRMATION, URINE - Normal    Narrative:     Any results reported as "positive" on this urine drug screen are unconfirmed screening results and should be used for medical(i.e.,treatment)purposes only. Unconfirmed screening results must not be used for non-medical purposes (e.g. employment or legal testing). Upon request, all results reported as "positive" can be sent to a reference laboratory for confirmation by GCMS.     Reporting Limits (cut-off concentrations)      Cocaine 300 ng/mL  Opiates 300 ng/mL  THC 50 ng/mL  Amphetamine 1000 ng/mL  Phencyclidine 25 ng/mL  Benzodiazepine 300 ng/mL  Barbiturates 300 ng/mL  Methadone 300 ng/mL  Oxycodone 100 ng/mL  Buprenorphine 5 ng/mL  Fentanyl 5 ng/mL     SALICYLATE ACID LEVEL - Normal   URINALYSIS, MACROSCOPIC - Normal   CBC/DIFF    Narrative:     The following orders were created for panel order CBC/DIFF.  Procedure                               Abnormality         Status                     ---------                               -----------         ------                     CBC WITH GMWN[[id number]]                Abnormal  Final result                 Please view results for these tests on the individual orders.   URINALYSIS, MACROSCOPIC AND MICROSCOPIC W/CULTURE REFLEX    Narrative:     The following orders were created for panel order URINALYSIS, MACROSCOPIC AND MICROSCOPIC W/CULTURE REFLEX.  Procedure                               Abnormality         Status                     ---------                               -----------         ------                     URINALYSIS, MACROSCOPIC[[id number]]      Normal              Final result               URINALYSIS, MICROSCOPIC[[id number]]                          Final result                 Please view results for these tests on the individual orders.   URINALYSIS, MICROSCOPIC     No orders to display       PENDING STUDIES AT TIME OF TRANSITION  none    ED COURSE/MEDICAL DECISION MAKING  Medications Administered in the ED   LORazepam (ATIVAN) tablet (0.5 mg Oral Given 01/21/23 0831)   metoprolol tartrate (LOPRESSOR) tablet (50 mg Oral Given 01/21/23 0831)   busPIRone (BUSPAR) tablet (30 mg Oral Given 01/21/23 0940)   QUEtiapine (SEROquel) tablet (200 mg Oral Given 01/21/23 0832)   lamoTRIgine (LaMICtal) tablet (200 mg Oral Given 01/21/23 0940)   venlafaxine (EFFEXOR XR) 24 hr extended release capsule 225 mg (225 mg Oral Given 01/21/23 0940)          Medical Decision  Making  Patient was awaiting placement at [facility] for a 1:1 PICU bed.     Risk  Prescription drug management.  Decision regarding hospitalization.      Consults  Case management    CLINICAL IMPRESSION  Clinical Impression   GAD (generalized anxiety disorder) (Primary)   Suicidal ideations   Homicidal ideations     DISPOSITION  Psych Facility       DISCHARGE MEDICATIONS  Current Discharge Medication List          Christin Fudge, FNP   01/21/2023, 12:33   [facility]  Department of Emergency Medicine  [facility]    This note was partially generated using MModal Fluency Direct system, and there may be some incorrect words, spellings, and punctuation that were not noted in checking the note before saving.

## 2023-01-22 MED ORDER — LORAZEPAM 0.5 MG TABLET
ORAL_TABLET | ORAL | Status: AC
Start: 2023-01-22 — End: 2023-01-22
  Filled 2023-01-22: qty 1

## 2023-01-22 MED ORDER — QUETIAPINE 100 MG TABLET
ORAL_TABLET | ORAL | Status: AC
Start: 2023-01-22 — End: 2023-01-22
  Filled 2023-01-22: qty 2

## 2023-01-22 MED ORDER — METOPROLOL TARTRATE 50 MG TABLET
ORAL_TABLET | ORAL | Status: AC
Start: 2023-01-22 — End: 2023-01-22
  Filled 2023-01-22: qty 1

## 2023-01-22 NOTE — ED Nurses Note (Signed)
Pt has been resting quietly this shift. No s/s of distress noted, no c/o voiced. 1:1 and video monitoring in effect.

## 2023-01-22 NOTE — ED Nurses Note (Addendum)
Patient resting on bed in position of most comfort. 1:1 at bedside. Denies needs. Resp even and unlabored.

## 2023-01-22 NOTE — ED Nurses Note (Signed)
Pt resting quietly, eyes closed. Respirations even, non-labored on RA. No s/s of distress noted. 1:1 and video monitoring in effect.

## 2023-01-22 NOTE — ED Nurses Note (Signed)
Patient currently sitting on psych bed reading a book at this time. Breaths are even and unlabored, patient appears to be in no distress. Patient denies any needs at this time. 1:1 monitoring in place at this time.

## 2023-01-22 NOTE — ED Nurses Note (Signed)
Patient resting on bed in position of most comfort. Denies needs. Sitter at bed side. Awaiting placement. Resp even and unlabored. No signs of distress noted.

## 2023-01-22 NOTE — ED APP Handoff Note (Signed)
[facility] - Emergency Department  Emergency Department  Handoff Note    Care/report received from [facility] @ 1610  Per report:  Cody Mccoy is a 41 y.o. male who had concerns including Suicidal Thoughts .     Pending labs/imaging/consults:  Accepted to the [facility] awaiting PICU bed  Plan:  No acute events overnight patient was signed out to oncoming provider    Course:      Disposition pending at the time of sign out. Care of Cody Mccoy was signed out to [employer] NP @ 0600 following a discussion of the patient's course. Please refer to their Course Note for further details of the patient's ED course.    Clinical Impression   GAD (generalized anxiety disorder) (Primary)   Suicidal ideations   Homicidal ideations         Emi Holes, PA-C

## 2023-01-23 ENCOUNTER — Inpatient Hospital Stay
Admission: RE | Admit: 2023-01-23 | Discharge: 2023-01-27 | DRG: 885 | Disposition: A | Discharge status: Home / Self Care | Payer: Medicaid Other | Source: Other Acute Inpatient Hospital | Attending: Psychiatry | Admitting: Psychiatry

## 2023-01-23 ENCOUNTER — Encounter (HOSPITAL_COMMUNITY): Payer: Self-pay

## 2023-01-23 ENCOUNTER — Encounter (HOSPITAL_PSYCHIATRIC): Payer: Self-pay | Admitting: Psychiatry

## 2023-01-23 ENCOUNTER — Other Ambulatory Visit: Payer: Self-pay

## 2023-01-23 DIAGNOSIS — I10 Essential (primary) hypertension: Secondary | ICD-10-CM | POA: Diagnosis present

## 2023-01-23 DIAGNOSIS — F3130 Bipolar disorder, current episode depressed, mild or moderate severity, unspecified: Secondary | ICD-10-CM | POA: Diagnosis present

## 2023-01-23 DIAGNOSIS — F3181 Bipolar II disorder: Principal | ICD-10-CM | POA: Diagnosis present

## 2023-01-23 DIAGNOSIS — Z62811 Personal history of psychological abuse in childhood: Secondary | ICD-10-CM

## 2023-01-23 DIAGNOSIS — R45850 Homicidal ideations: Secondary | ICD-10-CM | POA: Diagnosis present

## 2023-01-23 DIAGNOSIS — F4310 Post-traumatic stress disorder, unspecified: Secondary | ICD-10-CM | POA: Diagnosis present

## 2023-01-23 DIAGNOSIS — F411 Generalized anxiety disorder: Secondary | ICD-10-CM | POA: Diagnosis present

## 2023-01-23 DIAGNOSIS — R7303 Prediabetes: Secondary | ICD-10-CM | POA: Diagnosis present

## 2023-01-23 DIAGNOSIS — G4730 Sleep apnea, unspecified: Secondary | ICD-10-CM | POA: Diagnosis present

## 2023-01-23 DIAGNOSIS — F4320 Adjustment disorder, unspecified: Secondary | ICD-10-CM | POA: Diagnosis present

## 2023-01-23 DIAGNOSIS — Z560 Unemployment, unspecified: Secondary | ICD-10-CM

## 2023-01-23 DIAGNOSIS — R45851 Suicidal ideations: Principal | ICD-10-CM | POA: Diagnosis present

## 2023-01-23 DIAGNOSIS — F603 Borderline personality disorder: Secondary | ICD-10-CM | POA: Diagnosis present

## 2023-01-23 DIAGNOSIS — K219 Gastro-esophageal reflux disease without esophagitis: Secondary | ICD-10-CM | POA: Diagnosis present

## 2023-01-23 DIAGNOSIS — F17290 Nicotine dependence, other tobacco product, uncomplicated: Secondary | ICD-10-CM | POA: Diagnosis present

## 2023-01-23 DIAGNOSIS — Z79899 Other long term (current) drug therapy: Secondary | ICD-10-CM

## 2023-01-23 MED ORDER — LORAZEPAM 0.5 MG TABLET
ORAL_TABLET | ORAL | Status: AC
Start: 2023-01-23 — End: 2023-01-23
  Filled 2023-01-23: qty 1

## 2023-01-23 MED ORDER — METOPROLOL TARTRATE 25 MG TABLET
ORAL_TABLET | ORAL | Status: AC
Start: 2023-01-23 — End: 2023-01-23
  Filled 2023-01-23: qty 2

## 2023-01-23 MED ORDER — METOPROLOL TARTRATE 50 MG TABLET
50.0000 mg | ORAL_TABLET | Freq: Two times a day (BID) | ORAL | Status: DC
Start: 2023-01-23 — End: 2023-01-27
  Administered 2023-01-23 – 2023-01-27 (×8): 50 mg via ORAL
  Filled 2023-01-23 (×8): qty 1

## 2023-01-23 MED ORDER — ALUMINUM-MAG HYDROXIDE-SIMETHICONE 200 MG-200 MG-20 MG/5 ML ORAL SUSP
30.0000 mL | ORAL | Status: DC | PRN
Start: 2023-01-23 — End: 2023-01-27

## 2023-01-23 MED ORDER — TRAZODONE 50 MG TABLET
50.0000 mg | ORAL_TABLET | Freq: Every evening | ORAL | Status: DC | PRN
Start: 2023-01-23 — End: 2023-01-27
  Administered 2023-01-23 – 2023-01-26 (×4): 50 mg via ORAL
  Filled 2023-01-23 (×4): qty 1

## 2023-01-23 MED ORDER — QUETIAPINE 100 MG TABLET
ORAL_TABLET | ORAL | Status: AC
Start: 2023-01-23 — End: 2023-01-23
  Filled 2023-01-23: qty 2

## 2023-01-23 MED ORDER — BUSPIRONE 5 MG TABLET
ORAL_TABLET | ORAL | Status: AC
Start: 2023-01-23 — End: 2023-01-23
  Filled 2023-01-23: qty 6

## 2023-01-23 MED ORDER — MAGNESIUM HYDROXIDE 400 MG/5 ML ORAL SUSPENSION
30.0000 mL | Freq: Every evening | ORAL | Status: DC | PRN
Start: 2023-01-23 — End: 2023-01-27

## 2023-01-23 MED ORDER — ACETAMINOPHEN 325 MG TABLET
650.0000 mg | ORAL_TABLET | ORAL | Status: DC | PRN
Start: 2023-01-23 — End: 2023-01-27
  Administered 2023-01-25: 650 mg via ORAL
  Filled 2023-01-23: qty 2

## 2023-01-23 NOTE — Group Note (Signed)
Psychoeducational Group Note  Date of group:  01/23/2023  Start time of group:  1430  End time of group:  1530    Attend:  []   Not attend:  [x]                  Summary of group discussion:  Group completed word search puzzles based on the concepts of mental health.  While working on these puzzles, the counselor facilitated conversations on the different turns used in the past.      Cody Mccoy  s a 42 y.o. male participating in a psychoeducational group.        CommentsElmer Bales, [facility] 01/23/2023 15:31

## 2023-01-23 NOTE — ED Nurses Note (Signed)
Prs here for transport

## 2023-01-23 NOTE — Nurses Notes (Signed)
1900 TO 0700 SHIFT NOTE: PATIENT IS CALM AND COOPERATIVE, COMPLIANT WITH MEDICATIONS. PATIENT HAS BEEN INTERACTING WITH OTHER PATIENTS IN THE DAYROOM THIS EVENING. RATES ANXIETY AT 4/10 AND RATES DEPRESSION AT 3/10 ON SCALE 1 TO 10 WITH 10 BEING THE WORSE. DENIES SI, HI, OR AVH. REPORTS DECREASE IN SLEEP. REPORTS HAVING A GOOD APPETITE. VISUAL MONITORING DONE FOR SAFETY Q 15 MINUTES AND PRN BY STAFF.

## 2023-01-23 NOTE — Group Note (Signed)
Group topic:  STRESS MANAGEMENT    Date of group:  01/23/2023  Start time of group:  1000  End time of group:  1100    Attend:  []   Not Attend:  [x]                    Summary of group discussion:  ART AND MUSIC THERAPY  THE GROUP COLORED AND DREW PICTURES WHILE LISTENING TO SOOTHING MUSIC. THEY ALSO PRACTISED DEEP BREATHING AS WAYS TO REDUCE STRESS AND ANXIETY.       Cody Mccoy  is a 42 y.o. male participating in Stress Management.    Patient observations: patient did not attend group      Patient goals: for the patient to attend groups      Abbey Chatters, MEd  01/23/2023, 15:09

## 2023-01-23 NOTE — Nurses Notes (Signed)
Group topic:  Community Meeting    Date of group: 01/23/23  Start time of group:  1955  End time of group: 2013    Attend:  [x]   Not attend: []   Attendance:  inpatient attended all of group      Community Meeting    Cody Mccoy  is a 42 y.o. male participating in a community meeting group.    Patient observations:      Patient goals:      Clyda Greener, RN  01/23/2023, 20:30

## 2023-01-23 NOTE — ED APP Handoff Note (Signed)
Emergency Medicine      Name: Cody Mccoy  Age and Gender: 42 y.o. male  Date of Birth: [birth year]  MRN: [id number]    ED Course Since Sign Out:  Care assumed from nightshift APP pending psych placement. Patient was accepted to Dr Barry Dienes and will be transported by EMS      Filed Vitals:    01/21/23 1810 01/21/23 2021 01/22/23 0942 01/23/23 0600   BP: 139/86 (!) 138/92 134/87 (!) 132/96   Pulse: 86 91 90 83   Resp: 18  16 18    Temp:   36.6 C (97.8 F) 36.4 C (97.6 F)   SpO2: 95% 98% 98% 98%           Impression:   Clinical Impression   GAD (generalized anxiety disorder) (Primary)   Suicidal ideations   Homicidal ideations         Disposition: Psych Facility      Discharge Instructions Given to Patient:      / Maryagnes Amos PA-C  01/23/2023, 12:33  [facility]  Department of Emergency Medicine  [facility]      Portions of this note may have been dictated using voice recognition software.

## 2023-01-23 NOTE — Care Plan (Signed)
Patient new admission to facility  Problem: Adult Behavioral Health Plan of Care  Goal: Plan of Care Review  Outcome: Ongoing (see interventions/notes)  Goal: Patient-Specific Goal (Individualization)  Outcome: Ongoing (see interventions/notes)  Goal: Adheres to Safety Considerations for Self and Others  Outcome: Ongoing (see interventions/notes)  Goal: Absence of New-Onset Illness or Injury  Outcome: Ongoing (see interventions/notes)  Goal: Optimized Coping Skills in Response to Life Stressors  Outcome: Ongoing (see interventions/notes)  Goal: Develops/Participates in Therapeutic Alliance to Support Successful Transition  Outcome: Ongoing (see interventions/notes)  Intervention: Jobe Igo  Recent Flowsheet Documentation  Taken 01/23/2023 1429 by Gerald Leitz, RN  Trust Relationship/Rapport:   care explained   thoughts/feelings acknowledged  Goal: Rounds/Family Conference  Outcome: Ongoing (see interventions/notes)

## 2023-01-23 NOTE — ED Nurses Note (Signed)
Report called to [facility].

## 2023-01-23 NOTE — Consults (Signed)
[place] MEDICINE [facility]  Hospitalist Consultation    Date of Service:  01/23/2023  Cody Mccoy   42 y.o. male  Date of Admission:  01/23/2023  Date of Birth:  June 08, 1980  0         History of Present Illness:  Patient is a 42 year old Caucasian male who is here for psychiatric stabilization.  Patient was very pleasant cooperative.  Unfortunately he had to spend the last 4 days in the emergency department waiting on a bed over here at the [facility].  He was in good spirits however.  He has high blood pressure which she takes Lopressor for and sleep apnea.  He is on Lamictal but takes that for a mood stabilizer.  Again he was pleasant and cooperative and has no physical complaints at this time.                    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 No pertinent surgical history.   Family:    Family Medical History:    None        Social:   reports that he has never smoked. He has never used smokeless tobacco. He reports that he does not currently use alcohol. He reports that he does not use drugs.     REVIEW OF SYSTEMS:  Full ROS performed. ROS negative if not mentioned in HPI.    Allergies   Allergen Reactions    Latuda [Lurasidone] Mental Status Effect    Prednisone Mental Status Effect       Medications:  Medications Prior to Admission       Prescriptions    busPIRone (BUSPAR) 30 mg Oral Tablet    Take 1 Tablet (30 mg total) by mouth Twice daily    gabapentin (NEURONTIN) 300 mg Oral Capsule    Take 1 Capsule (300 mg total) by mouth Twice daily    hydrOXYzine pamoate (VISTARIL) 25 mg Oral Capsule    Take 1 Capsule (25 mg total) by mouth Every night    lamoTRIgine (LAMICTAL) 150 mg Oral Tablet    Take 200 mg by mouth Twice daily    LORazepam (ATIVAN) 0.5 mg Oral Tablet    Take 1 Tablet (0.5 mg total) by mouth Twice daily    metoprolol  tartrate (LOPRESSOR) 50 mg Oral Tablet    Take 1 Tablet (50 mg total) by mouth Twice daily    QUEtiapine (SEROQUEL) 200 mg Oral Tablet    Take 225 mg by mouth Every night 225 mg    venlafaxine (EFFEXOR XR) 150 mg Oral Capsule, Sust. Release 24 hr    Take 225 mg by mouth Every night          acetaminophen (TYLENOL) tablet, 650 mg, Oral, Q4H PRN  aluminum-magnesium hydroxide-simethicone (MAG-AL PLUS) 200-200-20 mg per 5 mL oral liquid, 30 mL, Oral, Q4H PRN  magnesium hydroxide (MILK OF MAGNESIA) 400mg  per 5mL oral liquid, 30 mL, Oral, HS PRN  metoprolol tartrate (LOPRESSOR) tablet, 50 mg, Oral, 2x/day  traZODone (DESYREL) tablet, 50 mg, Oral, HS PRN - MR x 1        Physical Exam:  Vitals:  Temperature: 36.6 C (97.8 F)  Heart Rate: 94  Respiratory Rate: 20  BP (Non-Invasive): (!) 140/95  SpO2: 100 %    Exam:  Nursing note and vitals reviewed.   General: No acute distress, alert and oriented x3  HEENT: Pharynx without no erythema, exudate. PERRLA, conjunctivae are without injection or scleral icterus  Neck: Neck supple without lymphadenopathy. Thyroid non-tender and without nodules.  Cardio: Regular rate and rhythm. Normal S1 and S2. No murmurs, gallops, or rubs. PMI located in the midclavicular line.   Resp: Clear to auscultation bilaterally. No wheezes, rales, rhonchi or crackles. Normal resp effort.  Abd: Bowel sounds present in all 4 quadrants. Negative murphy's sign. No rebound tenderness or involuntary guarding. Liver and spleen not palpable.  GU: Deferred exam  Extremities: No edema or swelling noted. No gait disturbances or weakness. Muscle strength 5/5 in bilateral upper and lower extremities.  Skin: No rashes, ulcers, or bruising.  Neuro: CN II - XII grossly intact  I: smell Not tested   II: visual acuity  Visual acuity appears normal bilaterally   II: visual fields Full to confrontation   II: pupils Equal, round, reactive to light   III,VII: ptosis none   III,IV,VI: extraocular muscles  Full ROM   V:  mastication normal   V: facial light touch sensation  normal   V,VII: corneal reflex  present   VII: facial muscle function - upper  normal   VII: facial muscle function - lower normal   VIII: hearing Not tested   IX: soft palate elevation  normal   IX,X: gag reflex present   XI: trapezius strength  5/5   XI: sternocleidomastoid strength 5/5   XI: neck flexion strength  5/5   XII: tongue strength  normal      [employer] [facility] [employer]  [phone] [facility]  [address]                        Phone [phone]  [place], [place]  [address]      Fax: [phone]        Labs:     No results found for any visits on 01/23/23 (from the past 24 hour(s)).    Imaging Studies:        No image results found.      Assessment/Plan:  Hypertension.  I will continue Lopressor.  I do appreciate this interesting consultation will follow along with Psychiatry.  Patient's family is going to bring his CPAP machine for tonight    Problem List:  Active Hospital Problems   (*Primary Problem)    Diagnosis    *Suicidal ideation       DVT/PE Prophylaxis:  Early ambulation      Leonides Cave, PA-C  [facility] MEDICINE HOSPITALIST

## 2023-01-24 ENCOUNTER — Encounter (HOSPITAL_PSYCHIATRIC): Payer: Self-pay | Admitting: Psychiatry

## 2023-01-24 ENCOUNTER — Encounter (HOSPITAL_PSYCHIATRIC): Payer: Self-pay

## 2023-01-24 DIAGNOSIS — F4310 Post-traumatic stress disorder, unspecified: Secondary | ICD-10-CM

## 2023-01-24 DIAGNOSIS — I10 Essential (primary) hypertension: Secondary | ICD-10-CM

## 2023-01-24 DIAGNOSIS — F411 Generalized anxiety disorder: Secondary | ICD-10-CM

## 2023-01-24 DIAGNOSIS — K219 Gastro-esophageal reflux disease without esophagitis: Secondary | ICD-10-CM

## 2023-01-24 DIAGNOSIS — F603 Borderline personality disorder: Secondary | ICD-10-CM

## 2023-01-24 DIAGNOSIS — F3181 Bipolar II disorder: Secondary | ICD-10-CM

## 2023-01-24 DIAGNOSIS — Z79899 Other long term (current) drug therapy: Secondary | ICD-10-CM

## 2023-01-24 DIAGNOSIS — R45850 Homicidal ideations: Secondary | ICD-10-CM

## 2023-01-24 DIAGNOSIS — F4320 Adjustment disorder, unspecified: Secondary | ICD-10-CM | POA: Diagnosis present

## 2023-01-24 LAB — LIPID PANEL
CHOL/HDL RATIO: 5.1
CHOLESTEROL: 173 mg/dL (ref <200)
HDL CHOL: 34 mg/dL — ABNORMAL LOW (ref >=40)
LDL CALC: 106 mg/dL — ABNORMAL HIGH (ref 0–100)
TRIGLYCERIDES: 164 mg/dL — ABNORMAL HIGH (ref <=150)
VLDL CALC: 33 mg/dL (ref 0–50)

## 2023-01-24 LAB — HGA1C (HEMOGLOBIN A1C WITH EST AVG GLUCOSE): HEMOGLOBIN A1C: 6 % (ref 4.0–6.0)

## 2023-01-24 LAB — GLUCOSE, NON FASTING: GLUCOSE: 111 mg/dL — ABNORMAL HIGH (ref 74–109)

## 2023-01-24 MED ORDER — VENLAFAXINE ER 75 MG CAPSULE,EXTENDED RELEASE 24 HR
225.0000 mg | ORAL_CAPSULE | Freq: Every day | ORAL | Status: DC
Start: 2023-01-24 — End: 2023-01-24

## 2023-01-24 MED ORDER — LAMOTRIGINE 100 MG TABLET
200.0000 mg | ORAL_TABLET | Freq: Two times a day (BID) | ORAL | Status: DC
Start: 2023-01-24 — End: 2023-01-27
  Administered 2023-01-24 – 2023-01-27 (×7): 200 mg via ORAL
  Filled 2023-01-24 (×7): qty 2

## 2023-01-24 MED ORDER — DULOXETINE 30 MG CAPSULE,DELAYED RELEASE
60.0000 mg | DELAYED_RELEASE_CAPSULE | Freq: Every day | ORAL | Status: DC
Start: 2023-01-24 — End: 2023-01-26
  Administered 2023-01-24 – 2023-01-26 (×3): 60 mg via ORAL
  Filled 2023-01-24 (×3): qty 2

## 2023-01-24 MED ORDER — QUETIAPINE 25 MG TABLET
225.0000 mg | ORAL_TABLET | Freq: Every evening | ORAL | Status: DC
Start: 2023-01-24 — End: 2023-01-27
  Administered 2023-01-24 – 2023-01-26 (×3): 225 mg via ORAL
  Filled 2023-01-24 (×3): qty 1

## 2023-01-24 MED ORDER — HYDROXYZINE PAMOATE 25 MG CAPSULE
25.0000 mg | ORAL_CAPSULE | Freq: Three times a day (TID) | ORAL | Status: DC | PRN
Start: 2023-01-24 — End: 2023-01-27
  Administered 2023-01-24: 25 mg via ORAL
  Filled 2023-01-24: qty 1

## 2023-01-24 MED ORDER — VENLAFAXINE ER 150 MG CAPSULE,EXTENDED RELEASE 24 HR
150.0000 mg | ORAL_CAPSULE | Freq: Every day | ORAL | Status: DC
Start: 2023-01-24 — End: 2023-01-26
  Administered 2023-01-24 – 2023-01-26 (×3): 150 mg via ORAL
  Filled 2023-01-24 (×3): qty 1

## 2023-01-24 MED ORDER — BUSPIRONE 10 MG TABLET
30.0000 mg | ORAL_TABLET | Freq: Two times a day (BID) | ORAL | Status: DC
Start: 2023-01-24 — End: 2023-01-27
  Administered 2023-01-24 – 2023-01-27 (×7): 30 mg via ORAL
  Filled 2023-01-24 (×7): qty 3

## 2023-01-24 MED ORDER — LORAZEPAM 0.5 MG TABLET
0.5000 mg | ORAL_TABLET | Freq: Two times a day (BID) | ORAL | Status: DC | PRN
Start: 2023-01-24 — End: 2023-01-27
  Administered 2023-01-25 – 2023-01-27 (×3): 0.5 mg via ORAL
  Filled 2023-01-24 (×3): qty 1

## 2023-01-24 MED ORDER — GABAPENTIN 300 MG CAPSULE
300.0000 mg | ORAL_CAPSULE | Freq: Two times a day (BID) | ORAL | Status: DC
Start: 2023-01-24 — End: 2023-01-27
  Administered 2023-01-24 – 2023-01-27 (×7): 300 mg via ORAL
  Filled 2023-01-24 (×7): qty 1
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
## 2023-01-24 NOTE — Group Note (Signed)
Group topic:  COMMUNITY MEETING GROUP    Date of group:  01/24/2023  Start time of group:  0900  End time of group:  0910    Attend:  [x]   Not attend: []   Attendance:  inpatient attended all of group                 Summary of group discussion: community meeting      Cody Mccoy  is a 42 y.o. male participating in a community meeting group.    Patient observations:      Patient goals:      Steward Ros, [facility]  01/24/2023, 09:20
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
## 2023-01-24 NOTE — Progress Notes (Signed)
[name] MEDICINE [facility]  BH IP PROGRESS NOTE      Cody Mccoy, Cody Mccoy  Date of Admission:  01/23/2023  Date of Birth:  [birth year]  Date of Service:  01/24/2023    Hospital Day:  LOS: 1 day     HPI:  Patient was in good spirits this morning.  He was in the hallway.  He was jovial.  Vital signs stable    Vital Signs:  Temp (24hrs) Max:36.6 C (97.8 F)      Temperature: 36.4 C (97.5 F)  BP (Non-Invasive): 135/86  Heart Rate: 74  Respiratory Rate: 18  SpO2: 95 %    Current Medications:  acetaminophen (TYLENOL) tablet, 650 mg, Oral, Q4H PRN  aluminum-magnesium hydroxide-simethicone (MAG-AL PLUS) 200-200-20 mg per 5 mL oral liquid, 30 mL, Oral, Q4H PRN  busPIRone (BUSPAR) tablet, 30 mg, Oral, 2x/day  DULoxetine (CYMBALTA) delayed release capsule, 60 mg, Oral, Daily  gabapentin (NEURONTIN) capsule, 300 mg, Oral, 2x/day  hydrOXYzine pamoate (VISTARIL) capsule, 25 mg, Oral, 3x/day PRN  lamoTRIgine (LaMICtal) tablet, 200 mg, Oral, 2x/day  LORazepam (ATIVAN) tablet, 0.5 mg, Oral, 2x/day PRN  magnesium hydroxide (MILK OF MAGNESIA) 400mg  per 5mL oral liquid, 30 mL, Oral, HS PRN  metoprolol tartrate (LOPRESSOR) tablet, 50 mg, Oral, 2x/day  QUEtiapine (SEROquel) tablet 225 mg, 225 mg, Oral, NIGHTLY  traZODone (DESYREL) tablet, 50 mg, Oral, HS PRN - MR x 1  venlafaxine (EFFEXOR XR) 24 hr extended release capsule, 150 mg, Oral, Daily        Current Orders:  Active Orders   Diet    DIET REGULAR Do you want to initiate MNT Protocol? Yes; Calorie amount: CC 2200     Frequency: All Meals     Number of Occurrences: 1 Occurrences   Nursing    BEHAVIORAL MED SAFETY CHECKS     Frequency: Q15MIN     Number of Occurrences: Until Specified    ENCOURAGE AMBULATION AND ROM EXERCISES     Frequency: UNTIL DISCONTINUED     Number of Occurrences: Until Specified    PT IS LOW RISK FOR VENOUS THROMBOEMBOLISM     Frequency: CONTINUOUS     Number of Occurrences: Until Specified    VITAL SIGNS  EVERY 12 HRS     Frequency: EVERY 12 HRS      Number of Occurrences: Until Specified   Code Status    FULL CODE: ATTEMPT RESUSCITATION / CPR     Frequency: CONTINUOUS     Number of Occurrences: Until Specified     Order Comments: Patient wishes for full ICU level care including advanced airway interventions / mechanical ventilation.     In the event of pulseless cardiac arrest, patient consents to ACLS (advanced cardiac life support) to attempt resuscitation.  IE - Consents to chest compressions, life support including intubation, mechanical ventilation, defibrillation/cardioversion as indicated.         Consult    IP CONSULT TO HOSPITALIST Requested Provider; Louis Meckel     Frequency: ONE TIME     Number of Occurrences: 1 Occurrences   Precaution    BMED SPECIAL PRECAUTIONS OBSERVE EVERY FIFTEEN MINUTES     Frequency: UNTIL DISCONTINUED     Number of Occurrences: Until Specified    LOW RISK SUICIDE PRECAUTIONS (PAVILION)     Frequency: UNTIL DISCONTINUED     Number of Occurrences: Until Specified   Privilege Level    PERMITTED VISITORS     Frequency: UNTIL DISCONTINUED     Number of  Occurrences: Until Specified   Medications    acetaminophen (TYLENOL) tablet     Frequency: Q4H PRN     Dose: 650 mg     Route: Oral    aluminum-magnesium hydroxide-simethicone (MAG-AL PLUS) 200-200-20 mg per 5 mL oral liquid     Frequency: Q4H PRN     Dose: 30 mL     Route: Oral    busPIRone (BUSPAR) tablet     Frequency: 2x/day     Dose: 30 mg     Route: Oral    DULoxetine (CYMBALTA) delayed release capsule     Frequency: Daily     Dose: 60 mg     Route: Oral    gabapentin (NEURONTIN) capsule     Frequency: 2x/day     Dose: 300 mg     Route: Oral    hydrOXYzine pamoate (VISTARIL) capsule     Frequency: 3x/day PRN     Dose: 25 mg     Route: Oral    lamoTRIgine (LaMICtal) tablet     Frequency: 2x/day     Dose: 200 mg     Route: Oral    LORazepam (ATIVAN) tablet     Frequency: 2x/day PRN     Dose: 0.5 mg     Route: Oral    magnesium hydroxide (MILK OF MAGNESIA) 400mg  per 5mL  oral liquid     Frequency: HS PRN     Dose: 30 mL     Route: Oral    metoprolol tartrate (LOPRESSOR) tablet     Frequency: 2x/day     Dose: 50 mg     Route: Oral    QUEtiapine (SEROquel) tablet 225 mg     Frequency: NIGHTLY     Dose: 225 mg     Route: Oral    traZODone (DESYREL) tablet     Frequency: HS PRN - MR x 1     Dose: 50 mg     Route: Oral    venlafaxine (EFFEXOR XR) 24 hr extended release capsule     Frequency: Daily     Dose: 150 mg     Route: Oral        Review of Systems:  Focused review of system was completed. Refer to the HPI for ROS details.     Today's Physical Exam:    General:  Patient is pleasant cooperative  HEENT: NC/AT   eyes  normal  Lungs:  Lungs clear, normal breathing, non-labored  CV: Regular rate and rhythm.   Abd:  non-tender, soft.  Extremities: no edema, normal appearance, pulses intact  Skin: no rashes, lesions,abscesses. Normal color.  Neuro: No focal deficits. Normal tone.         I/O:  I/O last 24 hours:  No intake or output data in the 24 hours ending 01/24/23 1024  I/O current shift:  No intake/output data recorded.    Labs  Please indicate ordered or reviewed)  Reviewed: Lab Results Today:    Results for orders placed or performed during the hospital encounter of 01/23/23 (from the past 24 hour(s))   LIPID PANEL   Result Value Ref Range    CHOLESTEROL 173 <200 mg/dL    TRIGLYCERIDES 371 (H) <=150 mg/dL    HDL CHOL 34 (L) >=69 mg/dL    LDL CALC 678 (H) 0 - 100 mg/dL    VLDL CALC 33 0 - 50 mg/dL    CHOL/HDL RATIO 5.1    HGA1C (HEMOGLOBIN A1C WITH EST AVG GLUCOSE)  Result Value Ref Range    HEMOGLOBIN A1C 6.0 4.0 - 6.0 %   GLUCOSE, NON FASTING   Result Value Ref Range    GLUCOSE 111 (H) 74 - 109 mg/dL          Problem List:  Active Hospital Problems   (*Primary Problem)    Diagnosis    *Suicidal ideation       Nutrition:   DIET REGULAR Do you want to initiate MNT Protocol? Yes; Calorie amount: CC 2200             Additional clinical characteristics related to nutrition:    -  monitor for weight changes   - monitor intake and output    - monitor bowel functions        Lab Results   Component Value Date    ALBUMIN 4.6 01/20/2023        Assessment/ Plan:  Hypertension.  Vital signs are stable.  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  We will continue to  Leonides Cave, PA-C

## 2023-01-24 NOTE — Group Note (Signed)
Group topic:  EDUCATION GROUP    Date of group:  01/24/2023  Start time of group:  1630  End time of group:  1715    Attend:  [x]   Not Attend:  []   Attendance:   FULL ATTENDANCE               Summary of group discussion:MEDICATION ACTIONS, USES AND SIDE EFFECTS. AND TIPS TO DECREASE ANXIETY      Cody Mccoy  is a 42 y.o. male participating in a experiential/activity group.    Patient's mental status/affect:    Patient's behavior:    Patient's response:    Burnard Hawthorne, RN  01/24/2023, 17:33

## 2023-01-24 NOTE — Group Note (Signed)
Group topic:  COMMUNITY MEETING GROUP    Date of group:  01/24/2023  Start time of group:  2100  End time of group:  2115    Attend:  []   Not attend: [x]   Attendance:  inpatient attended 00 minutes                 Summary of group discussion:      Nygil Luttrull  is a 42 y.o. male participating in a community meeting group.    Patient observations:      Patient goals:      Delma Post, PAT CARE [facility]  01/24/2023, 22:10

## 2023-01-24 NOTE — Group Note (Signed)
[facility] MEDICINE [facility], GERIATRIC BEHAVIORAL MEDICINE  [address]  [place] [address]    Group Note             Name: Cody Mccoy   Date of Birth: [birth year]   Today's Date: 01/23/2023   Group Start Time: 1300   Group End Time: 1400   Group Topic: ACTIVITY GROUP  Number of participants: 6      Summary of group discussion:   Out door group games with discussion pertaining to un locking the mental healt benefits of participation in recreational pursuits involving others : Sense of purpose, sense of belonging, improved self esteem, social connectedness.       [name]'s Participation and Response: Pt did not attend      Suicidal/Homicidal Risk:  Currently denies SI/HI and expresses willingness to contact crisis services if needed.

## 2023-01-24 NOTE — Care Plan (Signed)
CARE PLAN REVIEWED AND CONTINUES AS FOLLOWS

## 2023-01-24 NOTE — Nurses Notes (Signed)
7a-7p Shift Note: Patient calm and cooperative with assessment. He is alert and oriented X4. Patient is medication compliant. He rates anxiety 3/10 and depression 2/10. He denies SI/HI and A/V hallucinations. He reports sleeping and eating good. Will continue to monitor with visual rounds Q19min and PRN.

## 2023-01-24 NOTE — Group Note (Signed)
Psychoeducational Group Note  Date of group:  01/24/2023  Start time of group:  1430  End time of group:  1530    Attend:  [x]   Not attend:  []   Attendance:  inpatient attended all of group               Summary of group discussion:  The patient has colored while the worker facilitated a quiet environment for concentration and relaxation.      Cody Mccoy  s a 42 y.o. male participating in a psychoeducational group.    Affect/Mood:  Appropriate    Thought Process:  Logical    Thought Content:  Within normal limits    Interpersonal:  Discussed issues    Level of participation:  Full    Comments:     [name], [facility] 01/24/2023 15:52

## 2023-01-24 NOTE — Group Note (Signed)
Psychoeducational Group Note  Date of group:  01/24/2023  Start time of group:  1000  End time of group:  1100    Attend:  [x]   Not attend:  []                  Summary of group discussion:  10 Ways to Cope With Big Changes  Change is inevitable. Here's how to come out of it a better person.  Posted April 16, 2015   Reviewed by Elana Alm      Source: [email or web address] photo license  The one constant in life is change. That doesn't mean we ever get used to it or fully embrace it, though.  Here are 10 tips for coping with big changes in your life and coming out a better person for it.  1. Acknowledge that things are changing.  Sometimes we get so caught up in fighting change that we put off actually dealing with it. Denial is a powerful force, and it protects [place] in many ways. However, stepping outside of it and saying to yourself, "Things are changing, and it is okay" can be less stressful than putting it off.  2. Realize that even good change can cause stress.  Sometimes when people go through a positive life change, such as graduating or having a baby, they still feel a great deal of stress--or even dread. Keep in mind that positive change can create stress just like not-so-positive change. Stress is just your body's way of reacting to change. It's okay to feel stressed even when something good has happened--in fact, it's normal. (If you've just had a baby, talk to your doctor about whether you may be experiencing postpartum depression.)  3. Keep up your regular schedule as much as possible.  The more change that is happening, the more important it is to stick to your regular schedule--as much as possible. Having some things that stay the same, like walking the dog every morning at 8 am, gives [place] an anchor. An anchor is a reminder that some things are still the same, and it gives your brain a little bit of a rest. Sometimes when you are going through a lot of change it helps to write down your routine and  check it off as you go. It's one less thing for your brain to have to hold inside.  4. Try to eat as healthily as possible.  When change happens, a lot of [place] tend to reach for carbs--bread, muffins, cake, etc. This may be because eating carbs boosts serotonin--a brain chemical that may be somewhat depleted when you undergo change (stress). It's okay to soothe yourself with comfort foods--in moderation. One way to track what you are eating is to write it down. You can either do this in a notebook or use an app. When you see what you are eating, it makes you take a step back and think about whether you want to eat that second muffin or not. (If you have a history of eating disorders, it is not recommended that you write down what you are eating.) Also notice if you are exepriencing an increased use of alcohol or other substances; your use can sneak up on you when you are under stress.  5. Exercise.  Keeping up regular exercise could be a part of the "keep up your regular schedule" tip. If exercise is not currently part of your routine, try adding it. Exercising two to three times a week has been found to significantly  decrease symptoms of depression Elenore Paddy, et al. 2014.) Even just walking around the block can help you feel better. (Check with your doctor before starting an exercise program.) Remember, you don't have to feel like getting some exercise; just get out there and move. You'll find that many times your motivation will kick in while you are active.  article continues after advertisement  6. Seek support.  No one gets through life alone. It is okay to ask for help; that's a sign that you know yourself well enough to realize you need some assistance. Think of your trusted friends or family members. Chances are that they are happy to help if you need them to watch your kids while you run some errands, or if you just need some alone time. There may a neighbor who has asked you for help in the past--now maybe  you can ask them for help. Apps like [employer] have been helpful for connecting neighbors. If you are thinking about hurting yourself or killing yourself, please contact the Suicide Prevention Lifeline online or at (231)705-0506.  7. Write down the positives that have come from this change.  Maybe due to this change in your life you have met new people. Maybe you started practicing healthier habits. Maybe you became more politically active. Maybe you became more assertive. Maybe the change helped you prioritize what is most important in your life. Change presents [place] with the opportunity to grow, and it's important to acknowledge how things have become better as a result.  8. Get proactive.  Being proactive means taking charge and working preventatively. This means you figure out what steps you need to take before something happens. Being reactive means you wait until something has happened and then you take action. Being proactive means you make an appointment with your doctor for a physical because you know something stressful is coming up and you want to make sure you are in good health. It means becoming active with groups that help you realize that you can make a positive impact on the world.  9. Vent, but to a point.  Having a support group to whom you can vent can be helpful--to a point. If you and your support group are solely venting, that feeling of frustration can be contagious. Try gearing the conversation toward action: What can you do to make things better? When people brainstorm together, their creativity and hopefulness can be contagious as well.  10. Back away from social media.  When you go through change, you may gravitate toward social media--maybe posting to your friends on Facebook what is going on in your life. First, make sure you are in a calm state when you post--and keep in mind that whatever you post never really disappears. Also, if you are comparing your life to your friends' lives on  social media, remember that most people post only the "highlight reel" of their lives, not the stressful moments. This can give you a skewed view that everyone else's lives are going just fine. Everyone has battles they are fighting; it's just different battles with different people. Step away from social media if you are starting to compare your life to others.  And finally, give yourself a break. In a time of change, you may feel a little out of control. You may feel like you are not living up to your expectations for yourself. Remember that you are allowed to do less than what is humanly possible. Nothing says you have to function at  100 percent all the time. People make mistakes--it's one of the great things about being human. It's learning from the mistakes that really counts. Think about it like this: There are no mistakes, only good stories for later. Make a point to incorporate more laughter and fun into your life. Laughing increases dopamine, serotonin, and endorphins--and that makes you feel good Pearletha Alfred, 2016). Laughing also decreases cortisol--a stress-producing hormone Pearletha Alfred, 2016.)  Copyright 2017 [employer]. [email or web address]com      Cody Mccoy  s a 42 y.o. male participating in a psychoeducational group.    Affect/Mood:  Appropriate    Thought Process:  Logical    Thought Content:  Within normal limits    Interpersonal:  Discussed issues    Level of participation:  Full    Comments:     Abbey Chatters, MEd 01/24/2023 15:17

## 2023-01-24 NOTE — Care Plan (Signed)
PLAN OF CARE IS ONGOING. PATIENT IS CALM AND COOPERATIVE; COMPLIANT WITH MEDICATIONS; ENDORSES ANXIETY AND DEPRESSION; PROGRESSING TOWARDS CARE PLAN GOALS.       Problem: Adult Behavioral Health Plan of Care  Goal: Plan of Care Review  Outcome: Ongoing (see interventions/notes)  Flowsheets (Taken 01/23/2023 2100)  Patient Agreement with Plan of Care: agrees  Goal: Patient-Specific Goal (Individualization)  Outcome: Ongoing (see interventions/notes)  Flowsheets (Taken 01/23/2023 2100)  Individualized Care Needs: EFFECTIVE COPING SKILLS  Anxieties, Fears or Concerns: BEING ABLE TO GET BETTER MENTALLY  Patient-Specific Goals (Include Timeframe): DISCHARGE  Goal: Adheres to Safety Considerations for Self and Others  Outcome: Ongoing (see interventions/notes)  Intervention: Develop and Maintain Individualized Safety Plan  Recent Flowsheet Documentation  Taken 01/23/2023 2100 by Mercer Pod, RN  Safety Measures: safety rounds completed  Goal: Absence of New-Onset Illness or Injury  Outcome: Ongoing (see interventions/notes)  Intervention: Identify and Manage Fall Risk  Recent Flowsheet Documentation  Taken 01/23/2023 2100 by Mercer Pod, RN  Safety Measures: safety rounds completed  Intervention: Prevent Skin Injury  Recent Flowsheet Documentation  Taken 01/23/2023 2100 by Mercer Pod, RN  Skin Protection: adhesive use limited  Intervention: Prevent Infection  Recent Flowsheet Documentation  Taken 01/23/2023 2100 by Mercer Pod, RN  Infection Prevention: promote handwashing  Goal: Optimized Coping Skills in Response to Life Stressors  Outcome: Ongoing (see interventions/notes)  Intervention: Promote Effective Coping Strategies  Recent Flowsheet Documentation  Taken 01/23/2023 2100 by Mercer Pod, RN  Supportive Measures: active listening utilized  Goal: Develops/Participates in Therapeutic Alliance to Support Successful Transition  Outcome: Ongoing (see interventions/notes)  Intervention: Mal[facility] Gauze [facility]  Recent Flowsheet  Documentation  Taken 01/23/2023 2100 by Mercer Pod, RN  Trust Relationship/Rapport: care explained  Goal: Rounds/Family Conference  Outcome: Ongoing (see interventions/notes)     Problem: Suicide Risk  Goal: Absence of Self-Harm  Outcome: Ongoing (see interventions/notes)  Intervention: Promote Psychosocial Wellbeing  Recent Flowsheet Documentation  Taken 01/23/2023 2100 by Mercer Pod, RN  Family/Support System Care: self-care encouraged  Supportive Measures: active listening utilized

## 2023-01-25 DIAGNOSIS — F6089 Other specific personality disorders: Secondary | ICD-10-CM

## 2023-01-25 DIAGNOSIS — F419 Anxiety disorder, unspecified: Secondary | ICD-10-CM

## 2023-01-25 NOTE — Nurses Notes (Signed)
7P-7A Shift Summary: Patient up ad lib on the unit and in the day room this shift. Interaction noted with other patients. Pleasant and cooperative with this nurse. Compliant with HS meds, see MAR. Denies any problems with appetite, did eat hs snack this shift. Denies any problems with sleep. Denies SI, HI, A/V/T hallucinations this shift when asked. Rates anxiety as "0.5" and depression as "0" this shift. Patient has no noted distress and no complaints voiced or noted. Will continue to visually monitor q 15 minutes and PRN with assistance of staff.

## 2023-01-25 NOTE — Group Note (Signed)
[facility] MEDICINE [facility], GERIATRIC BEHAVIORAL MEDICINE  [address]  [place] [address]    Group Note             Name: Cody Mccoy   Date of Birth: 1980-07-20   Today's Date: 01/24/2023   Group Start Time: 1400   Group End Time: 1500   Group Topic: ACTIVITY GROUP  Number of participants: 6      Summary of group discussion:   Exercise Group : use of physical games to increase heart rate and effect cardiovascular endurance : Group discussion pertaining to the value of exercise in a balanced daily lifestyle.      [name]'s Participation and Response: Pt active in group exercise session. Able to espouse on positive benefits which can be derived from participation in moderate exercise.      Suicidal/Homicidal Risk:  Currently denies SI/HI and expresses willingness to contact crisis services if needed.
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
## 2023-01-25 NOTE — Progress Notes (Signed)
[place] MEDICINE [facility]  BH IP PROGRESS NOTE      Carl, Zales  Date of Admission:  01/23/2023  Date of Birth:  1980/08/14  Date of Service:  01/25/2023    Hospital Day:  LOS: 2 days     HPI:  Patient is doing fair with no new complaints.    Vital Signs:  Temp (24hrs) Max:37.1 C (98.7 F)      Temperature: 36.2 C (97.2 F)  BP (Non-Invasive): 111/72  Heart Rate: 70  Respiratory Rate: 19  SpO2: 96 %    Current Medications:  acetaminophen (TYLENOL) tablet, 650 mg, Oral, Q4H PRN  aluminum-magnesium hydroxide-simethicone (MAG-AL PLUS) 200-200-20 mg per 5 mL oral liquid, 30 mL, Oral, Q4H PRN  busPIRone (BUSPAR) tablet, 30 mg, Oral, 2x/day  DULoxetine (CYMBALTA) delayed release capsule, 60 mg, Oral, Daily  gabapentin (NEURONTIN) capsule, 300 mg, Oral, 2x/day  hydrOXYzine pamoate (VISTARIL) capsule, 25 mg, Oral, 3x/day PRN  lamoTRIgine (LaMICtal) tablet, 200 mg, Oral, 2x/day  LORazepam (ATIVAN) tablet, 0.5 mg, Oral, 2x/day PRN  magnesium hydroxide (MILK OF MAGNESIA) 400mg  per 5mL oral liquid, 30 mL, Oral, HS PRN  metoprolol tartrate (LOPRESSOR) tablet, 50 mg, Oral, 2x/day  QUEtiapine (SEROquel) tablet 225 mg, 225 mg, Oral, NIGHTLY  traZODone (DESYREL) tablet, 50 mg, Oral, HS PRN - MR x 1  venlafaxine (EFFEXOR XR) 24 hr extended release capsule, 150 mg, Oral, Daily        Current Orders:  Active Orders   Diet    DIET REGULAR Do you want to initiate MNT Protocol? Yes; Calorie amount: CC 2200     Frequency: All Meals     Number of Occurrences: 1 Occurrences   Nursing    BEHAVIORAL MED SAFETY CHECKS     Frequency: Q15MIN     Number of Occurrences: Until Specified    ENCOURAGE AMBULATION AND ROM EXERCISES     Frequency: UNTIL DISCONTINUED     Number of Occurrences: Until Specified    PT IS LOW RISK FOR VENOUS THROMBOEMBOLISM     Frequency: CONTINUOUS     Number of Occurrences: Until Specified    VITAL SIGNS  EVERY 12 HRS     Frequency: EVERY 12 HRS     Number of Occurrences: Until Specified   Code Status     FULL CODE: ATTEMPT RESUSCITATION / CPR     Frequency: CONTINUOUS     Number of Occurrences: Until Specified     Order Comments: Patient wishes for full ICU level care including advanced airway interventions / mechanical ventilation.     In the event of pulseless cardiac arrest, patient consents to ACLS (advanced cardiac life support) to attempt resuscitation.  IE - Consents to chest compressions, life support including intubation, mechanical ventilation, defibrillation/cardioversion as indicated.         Consult    IP CONSULT TO HOSPITALIST Requested Provider; Louis Meckel     Frequency: ONE TIME     Number of Occurrences: 1 Occurrences   Precaution    BMED SPECIAL PRECAUTIONS OBSERVE EVERY FIFTEEN MINUTES     Frequency: UNTIL DISCONTINUED     Number of Occurrences: Until Specified    LOW RISK SUICIDE PRECAUTIONS (PAVILION)     Frequency: UNTIL DISCONTINUED     Number of Occurrences: Until Specified   Privilege Level    PERMITTED VISITORS     Frequency: UNTIL DISCONTINUED     Number of Occurrences: Until Specified   Medications    acetaminophen (TYLENOL) tablet  Frequency: Q4H PRN     Dose: 650 mg     Route: Oral    aluminum-magnesium hydroxide-simethicone (MAG-AL PLUS) 200-200-20 mg per 5 mL oral liquid     Frequency: Q4H PRN     Dose: 30 mL     Route: Oral    busPIRone (BUSPAR) tablet     Frequency: 2x/day     Dose: 30 mg     Route: Oral    DULoxetine (CYMBALTA) delayed release capsule     Frequency: Daily     Dose: 60 mg     Route: Oral    gabapentin (NEURONTIN) capsule     Frequency: 2x/day     Dose: 300 mg     Route: Oral    hydrOXYzine pamoate (VISTARIL) capsule     Frequency: 3x/day PRN     Dose: 25 mg     Route: Oral    lamoTRIgine (LaMICtal) tablet     Frequency: 2x/day     Dose: 200 mg     Route: Oral    LORazepam (ATIVAN) tablet     Frequency: 2x/day PRN     Dose: 0.5 mg     Route: Oral    magnesium hydroxide (MILK OF MAGNESIA) 400mg  per 5mL oral liquid     Frequency: HS PRN     Dose: 30 mL      Route: Oral    metoprolol tartrate (LOPRESSOR) tablet     Frequency: 2x/day     Dose: 50 mg     Route: Oral    QUEtiapine (SEROquel) tablet 225 mg     Frequency: NIGHTLY     Dose: 225 mg     Route: Oral    traZODone (DESYREL) tablet     Frequency: HS PRN - MR x 1     Dose: 50 mg     Route: Oral    venlafaxine (EFFEXOR XR) 24 hr extended release capsule     Frequency: Daily     Dose: 150 mg     Route: Oral        Review of Systems:  Focused review of system was completed. Refer to the HPI for ROS details.     Today's Physical Exam:    General:  Patient is pleasant cooperative  HEENT: NC/AT   eyes  normal  Lungs:  Lungs clear, normal breathing, non-labored  CV: Regular rate and rhythm.   Abd:  non-tender, soft.  Extremities: no edema, normal appearance, pulses intact  Skin: no rashes, lesions,abscesses. Normal color.  Neuro: No focal deficits. Normal tone.         I/O:  I/O last 24 hours:  No intake or output data in the 24 hours ending 01/25/23 0942  I/O current shift:  No intake/output data recorded.    Labs  Please indicate ordered or reviewed)  Reviewed: Lab Results Today:  No results found for any visits on 01/23/23 (from the past 24 hour(s)).       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 Yes; Calorie amount: CC 2200  Additional clinical characteristics related to nutrition:    - monitor for weight changes   - monitor intake and output    - monitor bowel functions        Lab Results   Component Value Date    ALBUMIN 4.6 01/20/2023        Assessment/ Plan:  Hypertension.  Continue current meds stable from medical standpoint    Leonides Cave, PA-C

## 2023-01-25 NOTE — Nurses Notes (Signed)
7P-7A Shift Summary: Patient up ad lib on the unit and in the day room this shift. Interaction noted with other patients. Pleasant and cooperative with this nurse. Patient has been cheerful and smiling. Does laugh inappropriately at times. Compliant with HS meds, see MAR. Denies any problems with his appetite, did eat HS snack this shift. Denies any problems with his sleep. Denies SI, HI, A/V/T hallucinations this shift when asked. Rates anxiety as "2-3" and states "this is as low as it has been in several months." Rates his depression as "1" this shift. Patient has no noted distress and no complaints voiced or noted. Will continue to monitor Q 15 minutes and PRN with assistance of staff.

## 2023-01-25 NOTE — Care Plan (Signed)
PT COMPLIANT WITH MEDICATIONS AND TREATMENT, PROGRESSING TOWARDS CARE PLAN GOALS. DC PLANNING.     Problem: Adult Behavioral Health Plan of Care  Goal: Optimized Coping Skills in Response to Life Stressors  Outcome: Ongoing (see interventions/notes)     Problem: Suicide Risk  Goal: Absence of Self-Harm  Outcome: Ongoing (see interventions/notes)

## 2023-01-25 NOTE — Group Note (Signed)
[facility] MEDICINE [facility], GERIATRIC BEHAVIORAL MEDICINE  [address]  [place] [address]    Group Note             Name: Cody Mccoy   Date of Birth: 04-18-1980   Today's Date: 01/24/2023   Group Start Time: 1730   Group End Time: 1830   Group Topic: RECREATION GROUP  Number of participants: 9      Summary of group discussion:   In door group games to promote appropraite social interaction and decrease self imposed isolation .      [name]'s Participation and Response: Pt expressing improved sense of self and satisfaction with activity engaged in.      Suicidal/Homicidal Risk:  Currently denies SI/HI and expresses willingness to contact crisis services if needed.

## 2023-01-25 NOTE — Group Note (Signed)
Group topic:  COMMUNITY MEETING GROUP    Date of group:  01/25/2023  Start time of group:  0800  End time of group:  0810    Attend:  [x]   Not attend: []   Attendance:  inpatient attended all of group                 Summary of group discussion:      Cody Mccoy  is a 42 y.o. male participating in a community meeting group.    Patient observations:      Patient goals:      Anise Salvo, PAT CARE [facility]  01/25/2023, 08:12

## 2023-01-25 NOTE — Nurses Notes (Signed)
7A-7P NOTE: Patient is AOX4 and cooperative with care this shift. He is compliant with medications and assessment. He is up ad-lib around the unit, steady gait noted. He denies anxiety, depression, SI/HI and A/V/T hallucinations. Pleasant mood noted. No complaints of pain or any other concerns voiced. Skin intact. Admits to good appetite and rest. Will continue to monitor q15 mins per protocol and as needed.

## 2023-01-25 NOTE — Group Note (Signed)
Group topic:  STRESS MANAGEMENT    Date of group:  01/25/2023  Start time of group:  1000  End time of group:  1100    Attend:  [x]   Not Attend:  []                    Summary of group discussion:  ART AND MUSIC THERAPY  THE GROUP COLORED AND DREW PICTURES WHILE LISTENING TO SOOTHING MUSIC. THEY ALSO PRACTISED DEEP BREATHING AS WAYS TO REDUCE STRESS AND ANXIETY.       Cody Mccoy  is a 42 y.o. male participating in Stress Management.    Patient observations:  Patient attended and participated in groups appropriately.      Patient goals:  For the patient to continue to attend groups.      Abbey Chatters, MEd  01/25/2023, 13:22

## 2023-01-25 NOTE — Behavioral Health (Signed)
Patient is sitting in the dayroom socializing with other patients. Patient reports positive mood, feels much better. Discussed discharge of Friday, patient requests wait to see how he is doing Thursday, explained that Friday is the target date but he would be monitored between now and then. Patient is currently denying SI, HI or A/V/T hallucinations.

## 2023-01-25 NOTE — Group Note (Signed)
Group topic:  COMMUNITY MEETING GROUP    Date of group:  01/25/2023  Start time of group:  1945  End time of group:  2000    Attend:  []   Not attend: []   Attendance:  inpatient attended all of group                 Summary of group discussion:  Community meeting        Cody Mccoy  is a 42 y.o. male participating in a community meeting group.    Patient observations:      Patient goals:      Antonieta Loveless, PAT CARE [facility]  01/25/2023, 20:23

## 2023-01-25 NOTE — Progress Notes (Signed)
[facility]    Inpatient Psychiatric Progress Note    Patient's Full Name: Cody Mccoy   Patient's Date of Birth: 05-10-1980   Patient's Age: 42 y.o.   Patient's Legal Sex: male   Patient's MRN: [id number]   Patient's Date of Admission: 01/23/2023   Current Date: 01/25/2023 10:57     Patient's Room/Bed: 218/B       History of Present Illness:    Patient seen today on Geri. He is cordial, calm, smiling. Nursing feels he laughs inappropriately, this provider has not appreciated that. Patient is somewhat intrusive but mostly kind.     Relates he feels great, slept wonderfully. Denies SI/HI/AVH or nightmares. Feels he is ready to go home and felt he was ready from the ER after increase of Lamictal.     Medications and Allergies:    Current Facility-Administered Medications   Medication Dose Route Frequency    acetaminophen (TYLENOL) tablet  650 mg Oral Q4H PRN    aluminum-magnesium hydroxide-simethicone (MAG-AL PLUS) 200-200-20 mg per 5 mL oral liquid  30 mL Oral Q4H PRN    busPIRone (BUSPAR) tablet  30 mg Oral 2x/day    DULoxetine (CYMBALTA) delayed release capsule  60 mg Oral Daily    gabapentin (NEURONTIN) capsule  300 mg Oral 2x/day    hydrOXYzine pamoate (VISTARIL) capsule  25 mg Oral 3x/day PRN    lamoTRIgine (LaMICtal) tablet  200 mg Oral 2x/day    LORazepam (ATIVAN) tablet  0.5 mg Oral 2x/day PRN    magnesium hydroxide (MILK OF MAGNESIA) 400mg  per 5mL oral liquid  30 mL Oral HS PRN    metoprolol tartrate (LOPRESSOR) tablet  50 mg Oral 2x/day    QUEtiapine (SEROquel) tablet 225 mg  225 mg Oral NIGHTLY    traZODone (DESYREL) tablet  50 mg Oral HS PRN - MR x 1    venlafaxine (EFFEXOR XR) 24 hr extended release capsule  150 mg Oral Daily        Allergies   Allergen Reactions    Latuda [Lurasidone] Mental Status Effect    Prednisone Mental Status Effect          Vital Signs:    Filed Vitals:    01/24/23 0700 01/24/23 0900 01/24/23 1920 01/25/23 0700   BP: 95/63 135/86 (!) 195/114  111/72   Pulse: 74  89 70   Resp: 18  18 19    Temp: 36.4 C (97.5 F)  37.1 C (98.7 F) 36.2 C (97.2 F)   SpO2: 95%  95% 96%        Labs:    No results found for this or any previous visit (from the past 24 hour(s)).     Mental Status Examination:    Sensorium/Alertness: Alert, Awake, Orientation:  Date, Person, Place, Situation     Appearance:Appears stated age,Obese,      Psychomotor Activity: Normal,  Calm,  Cooperative,      Abnormal Behaviors: None,      Attitude Towards Examiner: Attentive, Cooperative,      Eye Contact: Normal,      Speech: Normal/Spontaneous,     Mood: Happy,  Ok  Affect: Appropriate,, Congruent,      Perception: WNL,      Thought Process: Logical/Clear/Goal Oriented,      Thought Content: Suicidal: Denies at this time  Thought Content: Homicidal: denies  Thought Content: Delusions: Denies  Impulse Control: Within Normal Limits,      Concentration/Calculation/Attention Span: WNL,  How  was the patient's Concentration/Calculation/Attention tested/assessed? Per observation and interview with patient   Recent Memory: WNL,   Remote Memory: WNL,     How was the patient's Remote Memory Tested/Assessed? Past Events, as it relates to history  Intelligence/Fund of Knowledge: Average  How was the patient's Intelligence/Fund of Knowledge Tested/Assessed? Based on history, Based on vocabulary, syntax, grammar, and content  Judgement:  Good  How was the patient's Judgement Tested/Assessed? Per patient's behavior/history of present illness  Insight: Good, How was the patient's Insight Tested/Assessed? 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  Medicines will be instituted and adjusted.  We have discussed medicine side-effects, treatment options, and pregnancy precautions if applicable.  The patient will be encouraged to be active in groups and milieu treatment.  Patient voices understanding and agrees with the plan.  Patient aware of unit rules and regulations and will be seen by the hospitalist for medical issues.  Further changes will be dictated by clinical course.   I estimate patient length of stay will be 7-10 day  -behavioral health safety checks   -regular diet, full code  -provisional discharge disposition:  Home with outpatient follow up.  Patient would likely benefit from SOP program will discuss with counseling; unfortunately due to insurance limitations does not qualify.      BuSpar 30 mg twice a day for anxiety, gabapentin 300 mg twice a day for anxiety, Effexor has been 225 mg a day we will decrease to 150 mg a day and add duloxetine 60 mg a day in an effort to titrate up duloxetine while titrating down Effexor and then discontinuing Effexor.  Seroquel 225 mg at bedtime nightly for mood, Lamictal 200 mg twice a day reminded patient to be mindful of new skin rashes sore places in his mouth.  Ativan 0.5 mg 2 times a day as needed for anxiety patient reports he has primarily been taking it bedtime, trazodone 50 mg at bedtime as needed for sleep.  Consider the addition of Inderal patient was already on Lopressor 50 mg b.i.d..  Discussed prazosin with patient he reports his nightmares are not that bothersome.      I certify that the inpatient psychiatric admission continues to be medically necessary for (1) treatment which is reasonably expected to improve the patient's condition, and/or (2) for diagnostic studies    Interaction Attestation: Face to face    Candie Mile, PMHNP-BC  01/25/2023 10:59

## 2023-01-26 MED ORDER — HYDROXYZINE PAMOATE 25 MG CAPSULE
25.0000 mg | ORAL_CAPSULE | Freq: Three times a day (TID) | ORAL | 0 refills | Status: AC | PRN
Start: 2023-01-26 — End: 2023-03-09

## 2023-01-26 MED ORDER — BUSPIRONE 30 MG TABLET
30.0000 mg | ORAL_TABLET | Freq: Two times a day (BID) | ORAL | 0 refills | Status: DC
Start: 2023-01-26 — End: 2023-02-09

## 2023-01-26 MED ORDER — TRAZODONE 50 MG TABLET
50.0000 mg | ORAL_TABLET | Freq: Every evening | ORAL | 0 refills | Status: DC | PRN
Start: 2023-01-26 — End: 2023-02-09

## 2023-01-26 MED ORDER — DULOXETINE 30 MG CAPSULE,DELAYED RELEASE
90.0000 mg | DELAYED_RELEASE_CAPSULE | Freq: Every day | ORAL | Status: DC
Start: 2023-01-27 — End: 2023-01-27
  Administered 2023-01-27: 90 mg via ORAL
  Filled 2023-01-26: qty 3

## 2023-01-26 MED ORDER — GABAPENTIN 300 MG CAPSULE
300.0000 mg | ORAL_CAPSULE | Freq: Two times a day (BID) | ORAL | 0 refills | Status: DC
Start: 2023-01-26 — End: 2023-02-09

## 2023-01-26 MED ORDER — LORAZEPAM 0.5 MG TABLET
0.5000 mg | ORAL_TABLET | Freq: Two times a day (BID) | ORAL | 0 refills | Status: DC | PRN
Start: 2023-01-26 — End: 2023-02-09

## 2023-01-26 MED ORDER — DULOXETINE 30 MG CAPSULE,DELAYED RELEASE
90.0000 mg | DELAYED_RELEASE_CAPSULE | Freq: Every day | ORAL | 0 refills | Status: DC
Start: 2023-01-27 — End: 2023-02-09

## 2023-01-26 MED ORDER — VENLAFAXINE ER 75 MG CAPSULE,EXTENDED RELEASE 24 HR
75.0000 mg | ORAL_CAPSULE | Freq: Every day | ORAL | 0 refills | Status: DC
Start: 2023-01-27 — End: 2023-02-09

## 2023-01-26 MED ORDER — VENLAFAXINE ER 75 MG CAPSULE,EXTENDED RELEASE 24 HR
75.0000 mg | ORAL_CAPSULE | Freq: Every day | ORAL | Status: DC
Start: 2023-01-27 — End: 2023-01-27
  Administered 2023-01-27: 75 mg via ORAL
  Filled 2023-01-26: qty 1

## 2023-01-26 MED ORDER — LAMOTRIGINE 200 MG TABLET
200.0000 mg | ORAL_TABLET | Freq: Two times a day (BID) | ORAL | 0 refills | Status: DC
Start: 2023-01-26 — End: 2023-02-09

## 2023-01-26 NOTE — Progress Notes (Signed)
[facility]    Inpatient Psychiatric Progress Note    Patient's Full Name: Cody Mccoy   Patient's Date of Birth: 08-18-1980   Patient's Age: 42 y.o.   Patient's Legal Sex: male   Patient's MRN: [id number]   Patient's Date of Admission: 01/23/2023   Current Date: 01/26/2023 10:32     Patient's Room/Bed: 218/B       History of Present Illness:    Patient seen this morning on Geri, he is up in the dayroom interacting with peers. Declines to go to another room for interview. "I feel wonderful, I slept great." Risa Grill talks about wearing his birthday suit for a costume today.   Denies SI/HI/AVH. Indicates he would like to follow up outpatient here.    Medications and Allergies:    Current Facility-Administered Medications   Medication Dose Route Frequency    acetaminophen (TYLENOL) tablet  650 mg Oral Q4H PRN    aluminum-magnesium hydroxide-simethicone (MAG-AL PLUS) 200-200-20 mg per 5 mL oral liquid  30 mL Oral Q4H PRN    busPIRone (BUSPAR) tablet  30 mg Oral 2x/day    DULoxetine (CYMBALTA) delayed release capsule  60 mg Oral Daily    gabapentin (NEURONTIN) capsule  300 mg Oral 2x/day    hydrOXYzine pamoate (VISTARIL) capsule  25 mg Oral 3x/day PRN    lamoTRIgine (LaMICtal) tablet  200 mg Oral 2x/day    LORazepam (ATIVAN) tablet  0.5 mg Oral 2x/day PRN    magnesium hydroxide (MILK OF MAGNESIA) 400mg  per 5mL oral liquid  30 mL Oral HS PRN    metoprolol tartrate (LOPRESSOR) tablet  50 mg Oral 2x/day    QUEtiapine (SEROquel) tablet 225 mg  225 mg Oral NIGHTLY    traZODone (DESYREL) tablet  50 mg Oral HS PRN - MR x 1    venlafaxine (EFFEXOR XR) 24 hr extended release capsule  150 mg Oral Daily        Allergies   Allergen Reactions    Latuda [Lurasidone] Mental Status Effect    Prednisone Mental Status Effect          Vital Signs:    Filed Vitals:    01/25/23 0700 01/25/23 1900 01/25/23 2043 01/26/23 0700   BP: 111/72 (!) 139/111 (!) 139/111 (!) 147/89   Pulse: 70 87 87 75   Resp: 19 19   18    Temp: 36.2 C (97.2 F) 36.7 C (98.1 F)  36.9 C (98.4 F)   SpO2: 96% 98%  98%        Labs:    No results found for this or any previous visit (from the past 24 hour(s)).     Mental Status Examination:    Sensorium/Alertness: Alert, Awake, Orientation:  Date, Person, Place, Situation     Appearance:Appears stated age,Obese,      Psychomotor Activity: Normal,  Calm,  Cooperative,      Abnormal Behaviors: None,      Attitude Towards Examiner: Attentive, Cooperative,      Eye Contact: Normal,      Speech: Normal/Spontaneous,     Mood: Happy,  Ok  Affect: Appropriate,, Congruent,      Perception: WNL,      Thought Process: Logical/Clear/Goal Oriented,      Thought Content: Suicidal: Denies at this time  Thought Content: Homicidal: denies  Thought Content: Delusions: Denies  Impulse Control: Within Normal Limits,      Concentration/Calculation/Attention Span: WNL,  How was the patient's Concentration/Calculation/Attention tested/assessed? Per observation  and interview with patient   Recent Memory: WNL,   Remote Memory: WNL,     How was the patient's Remote Memory Tested/Assessed? Past Events, as it relates to history  Intelligence/Fund of Knowledge: Average  How was the patient's Intelligence/Fund of Knowledge Tested/Assessed? Based on history, Based on vocabulary, syntax, grammar, and content  Judgement:  Good  How was the patient's Judgement Tested/Assessed? Per patient's behavior/history of present illness  Insight: Good, How was the patient's Insight Tested/Assessed? 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  Medicines will be instituted and adjusted.  We have discussed medicine side-effects, treatment options, and pregnancy precautions if applicable.  The patient will be encouraged to be active in groups and milieu treatment.  Patient voices understanding and agrees with the plan.  Patient aware of unit rules and regulations and will be seen by the hospitalist for medical issues.  Further changes will be dictated by clinical course.   I estimate patient length of stay will be 7-10 day  -behavioral health safety checks   -regular diet, full code  -provisional discharge disposition:  Home with outpatient follow up.  Patient would likely benefit from SOP program will discuss with counseling; unfortunately due to insurance limitations does not qualify. Discharge home 01/27/2023.      BuSpar 30 mg twice a day for anxiety, gabapentin 300 mg twice a day for anxiety, Effexor decrease to 75 mg a day and increase duloxetine 90 mg a day in an effort to titrate up duloxetine while titrating down Effexor and then discontinuing Effexor. This will have to be completed outpatient basis.  Seroquel 225 mg at bedtime nightly for mood, Lamictal 200 mg twice a day reminded patient to be mindful of new skin rashes sore places in his mouth.  Ativan 0.5 mg 2 times a day as needed for anxiety patient reports he has primarily been taking it bedtime, trazodone 50 mg at bedtime as needed for sleep.  Consider the addition of Inderal patient was already on Lopressor 50 mg b.i.d..  Discussed prazosin with patient he reports his nightmares are not that bothersome.:       I certify that the inpatient psychiatric admission continues to be medically necessary for (1) treatment which is reasonably expected to improve the patient's condition, and/or (2) for diagnostic studies    Interaction Attestation: Face to face    Candie Mile, PMHNP-BC  01/26/2023 10:36

## 2023-01-26 NOTE — Care Plan (Signed)
Care plan updated  Problem: Adult Behavioral Health Plan of Care  Goal: Plan of Care Review  Outcome: Ongoing (see interventions/notes)  Goal: Patient-Specific Goal (Individualization)  Outcome: Ongoing (see interventions/notes)  Goal: Strengths and Vulnerabilities  Outcome: Ongoing (see interventions/notes)  Goal: Adheres to Safety Considerations for Self and Others  Outcome: Ongoing (see interventions/notes)  Goal: Absence of New-Onset Illness or Injury  Outcome: Ongoing (see interventions/notes)  Goal: Optimized Coping Skills in Response to Life Stressors  Outcome: Ongoing (see interventions/notes)  Goal: Develops/Participates in Therapeutic Alliance to Support Successful Transition  Outcome: Ongoing (see interventions/notes)  Goal: Rounds/Family Conference  Outcome: Ongoing (see interventions/notes)     Problem: Suicide Risk  Goal: Absence of Self-Harm  Outcome: Ongoing (see interventions/notes)     Problem: Adult Treatment Plan  Goal: Patient will meet daily with attending physician.  Description: Physicians will evaluate patient, discuss diagnosis, prescribe medications and order diagnostic testing as clinically indicated. Physicians will monitor safety risks and assess when appropriate for discharge.  Outcome: Ongoing (see interventions/notes)  Goal: Patient will meet individually daily with Clinical Therapist.  Outcome: Ongoing (see interventions/notes)  Goal: Patient will deny suicidal/homicidal ideation by day of discharge to Physician.  Outcome: Ongoing (see interventions/notes)  Goal: Patient will participate in 100% of morning affirmation and psychoeducational groups provided daily by Mental Health Specialist.  Outcome: Ongoing (see interventions/notes)  Goal: Patient will participate in 100% of therapy groups facilitated by Clinical Therapist.  Description: Topics will include Dialectical Behavior Therapy with the components of Mindfulness, Distress Tolerance, Emotion Regulation and Crisis Survival  Skills, as well as Stress Management skills.  Outcome: Ongoing (see interventions/notes)  Goal: Patient will participate in 100% of experiential therapy group provided by Experiential Therapist.  Outcome: Ongoing (see interventions/notes)  Goal: Patient will participate in 100% of recreational groups provided by Recreational Therapist.  Outcome: Ongoing (see interventions/notes)

## 2023-01-26 NOTE — Nurses Notes (Signed)
7a-7p nurse note  Patient calm and cooperative.  Ambulates independently with a steady gait.  Patient spends time in day room socializing with staff and other patients.  Patient denies anxiety, depression, SI, HI and AVH.  Compliant with medication administration.  Patient states he is looking forward to DC tomorrow.  Snacks and drinks provided.  Will continue to monitor for safety q15 minutes and prn.

## 2023-01-26 NOTE — Discharge Instructions (Signed)
Trazodone: if you have a painful erection or an erection lasting longer than 4 hours must go to the ER.     Lamictal: if you develop new skin rashes or sore places in mouth seek medical attention urgently.     Effexor to Cymbalta: On 01/27/2023 order for Effexor 75 mg and Cymbalta 90 mg. I will send home prescriptions to this effect, you then can follow with your outpatient provider about decrease Effexor to 37.5 mg and increase Cymbalta to 120 mg then tapering off of Effexor.   Reason for Admission: INTRUSIVE THOUGHTS OF DRINKING UNTIL DRUNK. DEPRESSION WORSE. ANXIETY HIGH.    LAB TESTING      Lab Results   Component Value Date    HA1C 6.0 01/24/2023     No results found for: "GLUCOSEFAST"  Lab Results   Component Value Date    CHOLESTEROL 173 01/24/2023    LDLCHOL 106 (H) 01/24/2023    TRIG 164 (H) 01/24/2023     Lab Results   Component Value Date/Time    ALBUMIN 4.6 01/20/2023 02:31 PM    TOTALPROTEIN 8.0 01/20/2023 02:31 PM    ALKPHOS 134 (H) 01/20/2023 02:31 PM      Lab Results   Component Value Date/Time    AST 17 01/20/2023 02:31 PM    ALT 18 01/20/2023 02:31 PM     Lab Results   Component Value Date/Time    WBC 8.7 01/20/2023 02:31 PM    HGB 16.1 01/20/2023 02:31 PM    HCT 48.5 (H) 01/20/2023 02:31 PM    PLTCNT 220 01/20/2023 02:31 PM     No results found for: "COVID19", "COVID19PCR", "COVID19RES", "COVID19QP", "CORONA", "SARSCOV2RNA"  Lab Results   Component Value Date/Time    BUNCRRATIO 15 01/20/2023 02:31 PM      Pended studies: None

## 2023-01-26 NOTE — Group Note (Signed)
[facility] MEDICINE [facility], GERIATRIC BEHAVIORAL MEDICINE  [address]  [place] [address]    Group Note             Name: Cody Mccoy   Date of Birth: April 04, 1980   Today's Date: 01/25/2023   Group Start Time: 1300   Group End Time: 1400   Group Topic: RECREATION GROUP  Number of participants: 7      Summary of group discussion:   Out Door Group Games t promote appropriate socialization with group discussion pertaining to the positive benefits of participating in structured recreational pursuits with others.      Major's Participation and Response: Pt reported some successful self interventions utilizing rec and leisure as coping tool but also referred to negative thought process which does not allow him to make positive choices for self.      Suicidal/Homicidal Risk:  Currently denies SI/HI and expresses willingness to contact crisis services if needed.

## 2023-01-26 NOTE — Group Note (Signed)
Group topic:  COMMUNITY MEETING GROUP    Date of group:  01/26/2023  Start time of group:  1030  End time of group:  1047    Attend:  [x]   Not attend: []   Attendance:        [employer] of group discussion:      Cody Mccoy  is a 42 y.o. male participating in a community meeting group.    Patient observations:      Patient goals:      Cody Mccoy, PAT CARE [facility]  01/26/2023, 10:51

## 2023-01-26 NOTE — Group Note (Signed)
Group topic:  COMMUNITY MEETING GROUP    Date of group:  01/26/2023  Start time of group:  1030  End time of group:  1046    Attend:  [x]   Not attend: []   Attendance:           Community meeting        Summary of group discussion:      Cody Mccoy  is a 42 y.o. male participating in a community meeting group.    Patient observations:      Patient goals:      Eleonore Chiquito, PAT CARE [facility]  01/26/2023, 10:54
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
## 2023-01-26 NOTE — Group Note (Signed)
Group topic:  ACTIVITY GROUP    Date of group:  01/26/2023  Start time of group:  1730  End time of group:  1800     Attend:  [x]   Did not attend: []   Attendance:  inpatient attended all of group                   Summary of group discussion: bingo and music therapy      Myrle Dues  is a 42 y.o. male participating in a activity group.    Patient observations:      Patient goals:      Hoy Finlay, RN  01/26/2023, 18:13

## 2023-01-26 NOTE — Behavioral Health (Signed)
Patient is outside for group. Patient reports positive mood. Informed patient that he has been referred to outpatient here but we may not know if he is accepted before he discharges tomorrow so he will have an appointment at [facility] and he can change on his own. Patient is currently denying SI, HI or A/V/T hallucinations.

## 2023-01-27 NOTE — Group Note (Signed)
Group topic:  COMMUNITY MEETING GROUP    Date of group:  01/27/2023  Start time of group:  2000  End time of group:  2015    Attend:  [x]   Not attend: []   Attendance:  inpatient attended 15 minutes      Community Meeting           Summary of group discussion:      Cody Mccoy  is a 42 y.o. male participating in a community meeting group.    Patient observations:      Patient goals:      Felecia Jan, PAT CARE [facility]  01/27/2023, 01:04

## 2023-01-27 NOTE — Nurses Notes (Signed)
Patient discharged home with family.  AVS reviewed with patient.  A written copy of the AVS and discharge instructions was given to the patient.  Questions sufficiently answered as needed.  Patient encouraged to follow up with PCP as indicated and keep follow up appointment with psych.  In the event of an emergency, patient instructed to call 911 or go to the nearest emergency room.     PATIENT DOES NOT SMOKE.    PATIENT DENIES SI, HI, AND HALLUCINATIONS. PLEASANT OBSERVED IN THE DAY ROOM INTERACTING WITH OTHER PATIENTS. EATING AND DRINKING. TAKING MEDICATIONS. PATIENT IS PLEASANT AND COOPERATIVE.

## 2023-01-27 NOTE — Group Note (Signed)
Group topic:  COMMUNITY MEETING GROUP    Date of group:  01/27/2023  Start time of group:  0830  End time of group:  0845    Attend:  [x]   Not attend: []   Attendance:  inpatient attended all of group                 Summary of group discussion: COMMUNITY GUIDELINES DISCUSSED. GENERAL PATIENT RULES WERE DISCUSSED. EDUCATION PERTAINING TO DISCHARGE PLANNING WAS DISCUSSED AS WELL.      Cody Mccoy  is a 42 y.o. male participating in a community meeting group.    Patient observations:      Patient goals:      Vivia Birmingham, RN  01/27/2023, 09:09

## 2023-01-27 NOTE — Nurses Notes (Signed)
Patient was dressed and ambulating on unit in hallway and in dayroom, no distress noted.  Patient has been calm, cooperative, took medications without difficulty and has tolerated them well.  Patient denies SI/HI, A/V hallucinations, anxiety or depression during this shift.  All snacks and drinks encouraged. Patient is now lying in the bed resting quietly with eyes closed. No acute distress observed or noted.  Patient will continue to be visually monitored every 15 minutes by staff.

## 2023-02-09 ENCOUNTER — Ambulatory Visit: Payer: Medicaid Other | Attending: NURSE PRACTITIONER | Admitting: NURSE PRACTITIONER

## 2023-02-09 ENCOUNTER — Other Ambulatory Visit: Payer: Self-pay

## 2023-02-09 ENCOUNTER — Encounter (HOSPITAL_PSYCHIATRIC): Payer: Self-pay | Admitting: NURSE PRACTITIONER

## 2023-02-09 VITALS — BP 147/91 | HR 80 | Resp 18 | Ht 71.0 in | Wt 300.0 lb

## 2023-02-09 DIAGNOSIS — F3181 Bipolar II disorder: Secondary | ICD-10-CM | POA: Insufficient documentation

## 2023-02-09 DIAGNOSIS — Z62811 Personal history of psychological abuse in childhood: Secondary | ICD-10-CM

## 2023-02-09 DIAGNOSIS — F4320 Adjustment disorder, unspecified: Secondary | ICD-10-CM | POA: Insufficient documentation

## 2023-02-09 DIAGNOSIS — F411 Generalized anxiety disorder: Secondary | ICD-10-CM | POA: Insufficient documentation

## 2023-02-09 DIAGNOSIS — F4310 Post-traumatic stress disorder, unspecified: Secondary | ICD-10-CM | POA: Insufficient documentation

## 2023-02-09 DIAGNOSIS — F17200 Nicotine dependence, unspecified, uncomplicated: Secondary | ICD-10-CM

## 2023-02-09 MED ORDER — VENLAFAXINE ER 37.5 MG CAPSULE,EXTENDED RELEASE 24 HR
37.5000 mg | ORAL_CAPSULE | Freq: Every day | ORAL | 0 refills | Status: DC
Start: 2023-02-09 — End: 2023-02-22

## 2023-02-09 MED ORDER — TRAZODONE 50 MG TABLET
50.0000 mg | ORAL_TABLET | Freq: Every evening | ORAL | 0 refills | Status: DC | PRN
Start: 2023-02-09 — End: 2023-03-09

## 2023-02-09 MED ORDER — LAMOTRIGINE 200 MG TABLET
200.0000 mg | ORAL_TABLET | Freq: Two times a day (BID) | ORAL | 0 refills | Status: DC
Start: 2023-02-09 — End: 2023-03-09

## 2023-02-09 MED ORDER — QUETIAPINE 200 MG TABLET
200.0000 mg | ORAL_TABLET | Freq: Every evening | ORAL | 0 refills | Status: DC
Start: 2023-02-09 — End: 2023-02-09

## 2023-02-09 MED ORDER — QUETIAPINE 150 MG TABLET
150.0000 mg | ORAL_TABLET | Freq: Two times a day (BID) | ORAL | 0 refills | Status: DC
Start: 2023-02-09 — End: 2023-03-11

## 2023-02-09 MED ORDER — BUSPIRONE 30 MG TABLET
30.0000 mg | ORAL_TABLET | Freq: Two times a day (BID) | ORAL | 0 refills | Status: DC
Start: 2023-02-09 — End: 2023-03-09

## 2023-02-09 MED ORDER — ZIPRASIDONE 20 MG CAPSULE
20.0000 mg | ORAL_CAPSULE | Freq: Every evening | ORAL | 0 refills | Status: DC
Start: 2023-02-09 — End: 2023-03-09

## 2023-02-09 MED ORDER — DULOXETINE 30 MG CAPSULE,DELAYED RELEASE
90.0000 mg | DELAYED_RELEASE_CAPSULE | Freq: Every day | ORAL | 0 refills | Status: DC
Start: 2023-02-09 — End: 2023-03-09

## 2023-02-09 MED ORDER — GABAPENTIN 300 MG CAPSULE
300.0000 mg | ORAL_CAPSULE | Freq: Two times a day (BID) | ORAL | 0 refills | Status: DC
Start: 2023-02-09 — End: 2023-04-10

## 2023-02-09 MED ORDER — LORAZEPAM 0.5 MG TABLET
0.5000 mg | ORAL_TABLET | Freq: Two times a day (BID) | ORAL | 0 refills | Status: DC | PRN
Start: 2023-02-09 — End: 2023-03-09
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
## 2023-02-09 NOTE — Patient Instructions (Addendum)
Take medications only as prescribed. Keep all medical appointments to avoid care interruption. Follow up with your primary care provider (PCP) regularly and obtain blood labs annually.     Your Medications have changed: Decrease Effexor to taper off to stop, decrease quetiapine 200 mg by mouth at bedtime, continue all other medications. Follow up in one month.    What to do when medication does not work:...  Wait! Wait! Wait!  Taken lining is nighttime insomnia take at night if daytime sedation with you we or month before switching to another agent or adding other medications   Avoid mixing medications with drugs and alcohol.     Call the [facility] office if symptoms worsen or problems arise.  [phone].    *NOTE: It is best to consult your mental health provider before stopping medications.   When stopping mental health medications it is best to taper the medication to avoid withdrawal effects: dizziness, nausea, stomach cramps, sweating, tingling, or flu-like withdrawal symptoms, that  emerge during discontinuation. If these symptoms occur, residual or restart the medication to stop symptoms and then restart to taper down much more slowly.      Withdrawal symptoms can be more common or more severe with Effexor than some other antidepressants.  Abrupt discontinuation of fluoxetine/ Prozac because of the long half life, generally does not cause withdrawal symptoms.There is no need to taper stimulants, modafinil, buspirone, or hydroxyzine.

## 2023-02-13 MED ORDER — QUETIAPINE 200 MG TABLET
200.0000 mg | ORAL_TABLET | Freq: Every evening | ORAL | 0 refills | Status: DC
Start: 2023-02-13 — End: 2023-03-09

## 2023-02-13 NOTE — Addendum Note (Signed)
Addended by: Trish Fountain on: 02/13/2023 10:04 AM     Modules accepted: Orders

## 2023-02-22 ENCOUNTER — Encounter (HOSPITAL_PSYCHIATRIC): Payer: Self-pay | Admitting: NURSE PRACTITIONER

## 2023-02-22 MED ORDER — VENLAFAXINE ER 37.5 MG CAPSULE,EXTENDED RELEASE 24 HR
37.5000 mg | ORAL_CAPSULE | Freq: Every day | ORAL | 0 refills | Status: DC
Start: 2023-02-22 — End: 2023-03-09

## 2023-02-22 NOTE — Addendum Note (Signed)
Addended by: Trish Fountain on: 02/22/2023 02:45 PM     Modules accepted: Orders

## 2023-02-22 NOTE — Telephone Encounter (Signed)
Harlow Ohms from [facility] called and said patients insurance will not pay for the Effexor and the Cymbalta at the same time unless you send in a new prescription that details the taper.

## 2023-03-06 ENCOUNTER — Other Ambulatory Visit: Payer: Self-pay

## 2023-03-06 ENCOUNTER — Ambulatory Visit: Payer: Medicaid Other | Attending: COUNSELOR-PROFESSIONAL | Admitting: COUNSELOR-PROFESSIONAL

## 2023-03-06 DIAGNOSIS — Z8659 Personal history of other mental and behavioral disorders: Secondary | ICD-10-CM

## 2023-03-06 DIAGNOSIS — F411 Generalized anxiety disorder: Secondary | ICD-10-CM

## 2023-03-06 DIAGNOSIS — Z814 Family history of other substance abuse and dependence: Secondary | ICD-10-CM

## 2023-03-06 DIAGNOSIS — F4310 Post-traumatic stress disorder, unspecified: Secondary | ICD-10-CM

## 2023-03-06 DIAGNOSIS — Z818 Family history of other mental and behavioral disorders: Secondary | ICD-10-CM

## 2023-03-06 DIAGNOSIS — F3181 Bipolar II disorder: Secondary | ICD-10-CM

## 2023-03-06 DIAGNOSIS — F1091 Alcohol use, unspecified, in remission: Secondary | ICD-10-CM

## 2023-03-06 DIAGNOSIS — Z62811 Personal history of psychological abuse in childhood: Secondary | ICD-10-CM
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
## 2023-03-09 ENCOUNTER — Ambulatory Visit: Payer: Medicaid Other | Attending: NURSE PRACTITIONER | Admitting: NURSE PRACTITIONER

## 2023-03-09 ENCOUNTER — Encounter (HOSPITAL_PSYCHIATRIC): Payer: Self-pay | Admitting: NURSE PRACTITIONER

## 2023-03-09 ENCOUNTER — Other Ambulatory Visit: Payer: Self-pay

## 2023-03-09 VITALS — BP 142/78 | HR 88 | Resp 20 | Ht 72.0 in | Wt 300.0 lb

## 2023-03-09 DIAGNOSIS — F4310 Post-traumatic stress disorder, unspecified: Secondary | ICD-10-CM | POA: Insufficient documentation

## 2023-03-09 DIAGNOSIS — F4320 Adjustment disorder, unspecified: Secondary | ICD-10-CM | POA: Insufficient documentation

## 2023-03-09 DIAGNOSIS — F411 Generalized anxiety disorder: Secondary | ICD-10-CM | POA: Insufficient documentation

## 2023-03-09 DIAGNOSIS — F3181 Bipolar II disorder: Secondary | ICD-10-CM | POA: Insufficient documentation

## 2023-03-09 DIAGNOSIS — F3189 Other bipolar disorder: Secondary | ICD-10-CM

## 2023-03-09 MED ORDER — LAMOTRIGINE 200 MG TABLET
200.0000 mg | ORAL_TABLET | Freq: Two times a day (BID) | ORAL | 2 refills | Status: DC
Start: 2023-03-09 — End: 2023-06-07

## 2023-03-09 MED ORDER — DULOXETINE 30 MG CAPSULE,DELAYED RELEASE
90.0000 mg | DELAYED_RELEASE_CAPSULE | Freq: Every day | ORAL | 2 refills | Status: DC
Start: 2023-03-09 — End: 2023-06-07

## 2023-03-09 MED ORDER — LORAZEPAM 0.5 MG TABLET
0.5000 mg | ORAL_TABLET | Freq: Two times a day (BID) | ORAL | 2 refills | Status: DC | PRN
Start: 2023-03-09 — End: 2023-06-07

## 2023-03-09 MED ORDER — BUSPIRONE 30 MG TABLET
30.0000 mg | ORAL_TABLET | Freq: Two times a day (BID) | ORAL | 2 refills | Status: DC
Start: 2023-03-09 — End: 2023-06-07

## 2023-03-09 MED ORDER — TRAZODONE 50 MG TABLET
50.0000 mg | ORAL_TABLET | Freq: Every evening | ORAL | 2 refills | Status: DC | PRN
Start: 2023-03-09 — End: 2023-06-07

## 2023-03-09 MED ORDER — ZIPRASIDONE 40 MG CAPSULE
40.0000 mg | ORAL_CAPSULE | Freq: Every evening | ORAL | 2 refills | Status: DC
Start: 2023-03-09 — End: 2023-06-07

## 2023-03-09 MED ORDER — QUETIAPINE 150 MG TABLET
150.0000 mg | ORAL_TABLET | Freq: Every evening | ORAL | 2 refills | Status: DC
Start: 2023-03-09 — End: 2023-06-07

## 2023-03-09 NOTE — Progress Notes (Signed)
Progress Note    Patient's Full Name: Cody Mccoy   Patient's Date of Birth: Nov 16, 1980   Patient's Age: 42 y.o.   Patient's Legal Sex: male   Patient's MRN: [id number]     Current Date: 03/09/2023 12:49     CC: "  doing better"     History of Present Illness:Cody Mccoy 42 y.o. White male presents for medication management. Last appointment we Decrease Effexor to taper off to stop, decrease quetiapine 200 mg by mouth at bedtime, continue all other medications. Follow up in one month. Client agrees to take all prescribed medications only as directed and agrees to keep all appointments. (He informed that he only needs the Effexor 37.5 mg pills refilled.)      Today client relays "I am doing better, the Geodon is working really well, we made a mistake trying to do one tablet rather than three but the pharmacy got it worked out."    Denies suicidal, homicidal and self-harm thoughts or intentions. [employer]  denies auditory, visual, tactile hallucinations, no delusions, no paranoia.  Reports sleep as good, appetite adequate and mood as stable. . Denies adverse reactions to medications. Verbal consent to treat given. Reviewed treatment plan with client.  Denies questions or further concerns at this time.  Advised to contact the [facility] as needed for appointments, concerns, questions, or medication refills.     Vitals:    03/09/23 1241   BP: (!) 142/78   Pulse: 88   Resp: 20   Weight: 136 kg (300 lb)   Height: 1.829 m (6')   BMI: 40.69   During this visit we completed the PHQ9 ( 1);     Plan: Treatment and Medication Recommendation: continue medication, follow up in 3 months. 3 month refill provided.  Medications will be prescribed and adjusted as indicated by reported symptoms and medication efficacy.    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 Advised to contact provider with complaints, reports, questions, needs, or concerns. Discussed importance of taking medication only as prescribed and keeping all medical and mental health appointments.    We have discussed medication adverse-effects, treatment options, and pregnancy precautions if applicable.  The patient verbalizes agreement with this plan. Client agrees with the plan and will be encouraged to contact provider with questions and concerns. Patient has been instructed follow up plan. Further changes will be dictated by clinical course.      Thomasenia Sales, APRN,PMHNP-BC  [facility]

## 2023-03-09 NOTE — Patient Instructions (Signed)
Take medications only as prescribed. Keep all medical appointments to avoid care interruption. Follow up with your primary care provider (PCP) regularly and obtain blood labs annually.     continue medication, follow up in 3 months. 3 month refill provided.    What to do when medication does not work:...  Wait! Wait! Wait!  Taken lining is nighttime insomnia take at night if daytime sedation with you we or month before switching to another agent or adding other medications   Avoid mixing medications with drugs and alcohol.     Call the [facility] office if symptoms worsen or problems arise.  [phone].    *NOTE: It is best to consult your mental health provider before stopping medications.   When stopping mental health medications it is best to taper the medication to avoid withdrawal effects: dizziness, nausea, stomach cramps, sweating, tingling, or flu-like withdrawal symptoms, that  emerge during discontinuation. If these symptoms occur, residual or restart the medication to stop symptoms and then restart to taper down much more slowly.      Withdrawal symptoms can be more common or more severe with Effexor than some other antidepressants.  Abrupt discontinuation of fluoxetine/ Prozac because of the long half life, generally does not cause withdrawal symptoms.There is no need to taper stimulants, modafinil, buspirone, or hydroxyzine.

## 2023-03-20 ENCOUNTER — Ambulatory Visit: Payer: Medicaid Other | Attending: COUNSELOR-PROFESSIONAL | Admitting: COUNSELOR-PROFESSIONAL

## 2023-03-20 ENCOUNTER — Other Ambulatory Visit: Payer: Self-pay

## 2023-03-20 DIAGNOSIS — F4310 Post-traumatic stress disorder, unspecified: Secondary | ICD-10-CM

## 2023-03-20 DIAGNOSIS — F3181 Bipolar II disorder: Secondary | ICD-10-CM

## 2023-03-20 DIAGNOSIS — F411 Generalized anxiety disorder: Secondary | ICD-10-CM
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
## 2023-04-09 ENCOUNTER — Other Ambulatory Visit (HOSPITAL_PSYCHIATRIC): Payer: Self-pay | Admitting: NURSE PRACTITIONER

## 2023-04-10 ENCOUNTER — Ambulatory Visit: Payer: 59 | Attending: COUNSELOR-PROFESSIONAL | Admitting: COUNSELOR-PROFESSIONAL

## 2023-04-10 ENCOUNTER — Other Ambulatory Visit: Payer: Self-pay

## 2023-04-10 DIAGNOSIS — F411 Generalized anxiety disorder: Secondary | ICD-10-CM

## 2023-04-10 DIAGNOSIS — F3181 Bipolar II disorder: Secondary | ICD-10-CM

## 2023-04-10 DIAGNOSIS — F4310 Post-traumatic stress disorder, unspecified: Secondary | ICD-10-CM

## 2023-04-10 NOTE — Telephone Encounter (Signed)
I f you don't mind to approve or deny. Thank You

## 2023-04-10 NOTE — Telephone Encounter (Signed)
RX approved and encounter closed

## 2023-04-10 NOTE — Progress Notes (Signed)
BE[facility]AVIOR[facility] [place]  [address]  [place] [address]  Operated by [facility]    Name: Cody Mccoy MRN:  [id number]   Date: 04/10/2023 DOB:  01-10-1981 (43 y.o.)        Time In: 1300  Time Out: 1400     Diagnosis:   (F31.81) Bipolar II disorder, most recent episode major depressive (CMS HCC)  (primary encounter diagnosis)    (F41.1) GAD (generalized anxiety disorder)    (F43.10) PTSD (post-traumatic stress disorder)       Mental Status Examination:  Sensorium/Alertness: Alert  Orientation: Date , Person, Place, and Situation  Appearance: appears stated age in street clothes appropriately groomed  Psychomotor Activity: Normal  Abnormal Behaviors: None  Attitude Towards Examiner: Attentive  Eye Contact: Normal  Speech: normal  Mood: depressed  Affect: Appropriate  Perception: normal  Though Process: linear/logical  Thought Content: Within Normal Limits  Impulse Control: Within Normal Limits  Concentration/Calculation/Attention Span: impaired  How was the patient's Concentration/Calculation/Attention tested/assessed? Per observation and interview with patient   Recent Memory: grossly normal  Remote Memory: grossly normal  How was the patient's Remote Memory Tested/Assessed? Past Events, as it relates to history  Intelligence/Fund of Knowledge:  Average  How was the patient's Intelligence/Fund of Knowledge Tested/Assessed? based on history.  Judgement: Fair  How was the patient's Judgement Tested/Assessed? Per patient's behavior/history of present illness  Insight: Adequate  How was the patient's Insight Tested/Assessed? Understanding of severity of illness/history of present illness     Risk Assessment:     Based upon patient presentation and therapist observation during session, patient appears not to be SI/HI.  In the event of increase in SI or HI, I recommended the patient use the following resources to reduce risk of harm to self or  others.  Call 9-1-1  Go to the nearest emergency room  Call the National Suicide Prevention Lifeline (988)  Suicide Prevention Lifeline Online Chat - [email or web address]  Crisis Call: [phone] or (304) 325-HOPE    [x]  Client denies all areas of risk. No contrary clinical indications present.   Area of Risk:     Level of Risk:      Intent to Act:    Plan to Act:    Means to Act:  []  Low      []  Yes     []  Yes     []  Yes  []  Medium     []  No     []  No     []  No  []  High     []  Not Applicable   []  Not Applicable   []  Not Applicable  []  Imminent    Risk Factors:   Protective Factors:     Current Outpatient Medications   Medication Sig    busPIRone (BUSPAR) 30 mg Oral Tablet Take 1 Tablet (30 mg total) by mouth Twice daily for 90 days Indications: repeated episodes of anxiety    Cholecalciferol, Vitamin D3, (VITAMIN D) 25 mcg (1,000 unit) Oral Capsule Take 1 Capsule (1,000 Units total) by mouth Once a day    DULoxetine (CYMBALTA DR) 30 mg Oral Capsule, Delayed Release(E.C.) Take 3 Capsules (90 mg total) by mouth Once a day for 90 days Indications: anxiousness associated with depression    gabapentin (NEURONTIN) 300 mg Oral Capsule TAKE 1 CAPSULE BY MOUTH TWICE DAILY FOR 30 DAYS FOR ANXIETY/CHRONIC PAIN    lamoTRIgine (LAMICTAL) 200 mg Oral Tablet Take 1 Tablet (  200 mg total) by mouth Twice daily for 90 days Indications: bipolar depression    LORazepam (ATIVAN) 0.5 mg Oral Tablet Take 1 Tablet (0.5 mg total) by mouth Twice per day as needed for Anxiety for up to 90 days Indications: anxious    metoprolol tartrate (LOPRESSOR) 50 mg Oral Tablet Take 1 Tablet (50 mg total) by mouth Twice daily    QUEtiapine (SEROQUEL) 150 mg Oral Tablet Take 1 Tablet (150 mg total) by mouth Every night for 90 days Indications: bipolar depression    traZODone (DESYREL) 50 mg Oral Tablet Take 1 Tablet (50 mg total) by mouth Every night as needed for Insomnia for up to 90 days Indications: insomnia associated with  depression    ziprasidone (GEODON) 40 mg Oral Capsule Take 1 Capsule (40 mg total) by mouth Every night for 90 days Indications: bipolar depression, insomnia        Symptom Description/Subjective Report: Cody Mccoy presents today for follow-up of Bipolar, GAD, and PTSD; ct reports his anxiety as a 4 and his depression as a 6 on a likert scale where 10 is the highest acuity. Ct reports that he is having issues with his back and legs and is wanting to pursue disability for the same which he feels is exacerbating his depression. Ct continues to have focus issues, he is very distractible, cannot often finish sentences before moving to another thought. Ct has suffered from ADHD since childhood, reports that he often got into trouble and had reading and math comprehension issues. Was supposed to receive medication as a child but "my father wouldn't pay for it because it was too expensive." Ct reports poor memory since his last episode where he had to be moved inpatient.    Objective Content: Clinician completed a check-in with the ct regarding sx's; aided ct in exploring feelings of resentment toward his father, was unable to accept mother's role in childrearing and see's her as a victim as well as she would and continues to appease his father. Continue to address trauma from childhood and adulthood and yo-yo'ing relationship with his father.    Interventions Used:   Exposure Therapy, Trauma Therapy, and Cognitive Challenging      Goal Progress: progressing    Additional Notes / Assessment:     Recommendation: Continue current therapeutic focus.    Plan: Cody Mccoy will benefit from regular psychotherapy. Patient will return to this clinic in 3 week(s) for follow-up.    Emeterio Reeve, Licensed Professional Counselor  04/10/2023, 13:00

## 2023-05-01 ENCOUNTER — Other Ambulatory Visit: Payer: Self-pay

## 2023-05-01 ENCOUNTER — Ambulatory Visit (HOSPITAL_PSYCHIATRIC): Payer: Self-pay | Admitting: NURSE PRACTITIONER

## 2023-05-01 ENCOUNTER — Ambulatory Visit: Payer: Medicaid Other | Attending: COUNSELOR-PROFESSIONAL | Admitting: COUNSELOR-PROFESSIONAL

## 2023-05-01 DIAGNOSIS — F4310 Post-traumatic stress disorder, unspecified: Secondary | ICD-10-CM

## 2023-05-01 DIAGNOSIS — F3181 Bipolar II disorder: Secondary | ICD-10-CM

## 2023-05-01 DIAGNOSIS — F4320 Adjustment disorder, unspecified: Secondary | ICD-10-CM

## 2023-05-01 DIAGNOSIS — F411 Generalized anxiety disorder: Secondary | ICD-10-CM

## 2023-05-01 NOTE — Progress Notes (Signed)
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 Per observation and interview with patient   Recent Memory: grossly normal  Remote Memory: grossly normal  How was the patient's Remote Memory Tested/Assessed? Past Events, as it relates to history  Intelligence/Fund of Knowledge:  Average  How was the patient's Intelligence/Fund of Knowledge Tested/Assessed? based on history.  Judgement: Fair  How was the patient's Judgement Tested/Assessed? Per patient's behavior/history of present illness  Insight: Adequate  How was the patient's Insight Tested/Assessed? Understanding of severity of illness/history of present illness     Risk Assessment:     Based upon patient presentation and therapist observation during session, patient appears not to be SI/HI.  In the event of increase in SI or HI, I recommended the patient use the following resources to reduce  risk of harm to self or others.  Call 9-1-1  Go to the nearest emergency room  Call the National Suicide Prevention Lifeline (988)  Suicide Prevention Lifeline Online Chat - [email or web address]  Crisis Call: (619) 470-3933 or (304) 325-HOPE    [x]  Client denies all areas of risk. No contrary clinical indications present.   Area of Risk:     Level of Risk:      Intent to Act:    Plan to Act:    Means to Act:  []  Low      []  Yes     []  Yes     []  Yes  []  Medium     []  No     []  No     []  No  []  High     []  Not Applicable   []  Not Applicable   []  Not Applicable  []  Imminent    Risk Factors:   Protective Factors:     Current Outpatient Medications   Medication Sig    busPIRone (BUSPAR) 30 mg Oral Tablet Take 1 Tablet (30 mg total) by mouth Twice daily for 90 days Indications: repeated episodes of anxiety    Cholecalciferol, Vitamin D3, (VITAMIN D) 25 mcg (1,000 unit) Oral Capsule Take 1 Capsule (1,000 Units total) by mouth Once a day    DULoxetine (CYMBALTA DR) 30 mg Oral Capsule, Delayed Release(E.C.) Take 3 Capsules (90 mg total) by mouth Once a day for 90 days Indications: anxiousness associated with depression    gabapentin (NEURONTIN) 300 mg Oral Capsule TAKE 1 CAPSULE BY MOUTH TWICE DAILY FOR 30 DAYS FOR ANXIETY/CHRONIC PAIN    lamoTRIgine (LAMICTAL) 200  mg Oral Tablet Take 1 Tablet (200 mg total) by mouth Twice daily for 90 days Indications: bipolar depression    LORazepam (ATIVAN) 0.5 mg Oral Tablet Take 1 Tablet (0.5 mg total) by mouth Twice per day as needed for Anxiety for up to 90 days Indications: anxious    metoprolol tartrate (LOPRESSOR) 50 mg Oral Tablet Take 1 Tablet (50 mg total) by mouth Twice daily    QUEtiapine (SEROQUEL) 150 mg Oral Tablet Take 1 Tablet (150 mg total) by mouth Every night for 90 days Indications: bipolar depression    traZODone (DESYREL) 50 mg Oral Tablet Take 1 Tablet (50 mg total) by mouth Every night as needed for Insomnia for up to 90 days Indications:  insomnia associated with depression    ziprasidone (GEODON) 40 mg Oral Capsule Take 1 Capsule (40 mg total) by mouth Every night for 90 days Indications: bipolar depression, insomnia        Symptom Description/Subjective Report: Cody Mccoy presents today for follow-up of Bipolar II, GAD, PTSD, ct reports that his depression is 5 and his anxiety is 4 on a scale where 10 is the highest acuity. Ct reports he is doing well with his temper but has realized that the pain from his back and neck injury causes him to be shorter tempered. He was able to identify laying down, walking away, and going to the pool as ways to manage his pain so that he does not get as angry as he did before with the incident with his father.    Objective Content: Clinician completed a check-in with the ct regarding sx's and was able to aid ct in exploring coping skills to help him control his temper when it comes to the relationships with his father.     Interventions Used:   Motivational Interviewing, Supportive Reflection, and Trauma Therapy      Goal Progress: progressing    Additional Notes / Assessment:     Recommendation: Continue current therapeutic focus.    Plan: Cody Mccoy will benefit from regular psychotherapy. Patient will return to this clinic in 3 week(s) for follow-up.    Emeterio Reeve, Licensed Professional Counselor  05/01/2023, 08:56

## 2023-05-03 ENCOUNTER — Other Ambulatory Visit (HOSPITAL_PSYCHIATRIC): Payer: Self-pay | Admitting: NURSE PRACTITIONER

## 2023-05-03 NOTE — Telephone Encounter (Signed)
This is a patient of [facility] if you don't mind to approve or deny. Thank You

## 2023-05-11 ENCOUNTER — Other Ambulatory Visit (HOSPITAL_COMMUNITY): Payer: Self-pay | Admitting: FAMILY PRACTICE

## 2023-05-11 ENCOUNTER — Ambulatory Visit
Admission: RE | Admit: 2023-05-11 | Discharge: 2023-05-11 | Disposition: A | Discharge status: Home / Self Care | Payer: Medicaid Other | Source: Ambulatory Visit | Attending: FAMILY PRACTICE | Admitting: FAMILY PRACTICE

## 2023-05-11 ENCOUNTER — Other Ambulatory Visit: Payer: Self-pay

## 2023-05-11 DIAGNOSIS — M545 Low back pain, unspecified: Secondary | ICD-10-CM

## 2023-05-15 ENCOUNTER — Other Ambulatory Visit (HOSPITAL_PSYCHIATRIC): Payer: Self-pay | Admitting: NURSE PRACTITIONER

## 2023-05-15 NOTE — Telephone Encounter (Signed)
 Refill too soon

## 2023-05-21 ENCOUNTER — Other Ambulatory Visit (HOSPITAL_PSYCHIATRIC): Payer: Self-pay | Admitting: NURSE PRACTITIONER

## 2023-05-22 ENCOUNTER — Other Ambulatory Visit: Payer: Self-pay

## 2023-05-22 ENCOUNTER — Ambulatory Visit: Payer: Medicaid Other | Attending: COUNSELOR-PROFESSIONAL | Admitting: COUNSELOR-PROFESSIONAL

## 2023-05-22 DIAGNOSIS — F3189 Other bipolar disorder: Secondary | ICD-10-CM

## 2023-05-22 DIAGNOSIS — F3181 Bipolar II disorder: Secondary | ICD-10-CM

## 2023-05-22 DIAGNOSIS — F411 Generalized anxiety disorder: Secondary | ICD-10-CM

## 2023-05-22 DIAGNOSIS — F4320 Adjustment disorder, unspecified: Secondary | ICD-10-CM

## 2023-05-22 DIAGNOSIS — F4310 Post-traumatic stress disorder, unspecified: Secondary | ICD-10-CM

## 2023-05-22 NOTE — Telephone Encounter (Signed)
 Refill too soon. Dispense report says last picked up 05/03/23

## 2023-05-22 NOTE — Progress Notes (Unsigned)
 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  Psychomotor Activity: Normal  Abnormal Behaviors: None  Attitude Towards Examiner: Cooperative  Eye Contact: Normal  Speech: normal  Mood: appropriate to circumstances  Affect: Appropriate  Perception: normal  Though Process: linear/logical  Thought Content: Within Normal Limits  Impulse Control: Good  Concentration/Calculation/Attention Span: within normal limits  How was the patient's Concentration/Calculation/Attention tested/assessed? Per observation and interview with patient   Recent Memory: grossly normal  Remote Memory: grossly normal  How was the patient's Remote Memory Tested/Assessed? Past Events, as it relates to history  Intelligence/Fund of Knowledge:  Average  How was the patient's Intelligence/Fund of Knowledge Tested/Assessed? based on history.  Judgement: Fair  How was the patient's Judgement Tested/Assessed? Per patient's behavior/history of present illness  Insight: Adequate  How was the patient's Insight Tested/Assessed? Understanding of severity of illness/history of present illness     Risk Assessment:     Based upon patient presentation and therapist observation during session, patient appears not to be SI/HI.  In the event of increase in SI or HI, I recommended the  patient use the following resources to reduce risk of harm to self or others.  Call 9-1-1  Go to the nearest emergency room  Call the National Suicide Prevention Lifeline (988)  Suicide Prevention Lifeline Online Chat - [email or web address]  Crisis Call: [phone] or (304) 325-HOPE    [x]  Client denies all areas of risk. No contrary clinical indications present.   Area of Risk:     Level of Risk:      Intent to Act:    Plan to Act:    Means to Act:  []  Low      []  Yes     []  Yes     []  Yes  []  Medium     []  No     []  No     []  No  []  High     [x]  Not Applicable   [x]  Not Applicable   [x]  Not Applicable  []  Imminent    Risk Factors:   Protective Factors:     Current Outpatient Medications   Medication Sig    busPIRone (BUSPAR) 30 mg Oral Tablet Take 1 Tablet (30 mg total) by mouth Twice daily for 90 days Indications: repeated episodes of anxiety    Cholecalciferol, Vitamin D3, (VITAMIN D) 25 mcg (1,000 unit) Oral Capsule Take 1 Capsule (1,000 Units total) by mouth Once a day    DULoxetine (CYMBALTA DR) 30 mg Oral Capsule, Delayed Release(E.C.) Take 3 Capsules (90 mg total) by mouth Once a day for 90 days Indications: anxiousness associated with depression    gabapentin (NEURONTIN) 300 mg Oral Capsule TAKE 1 CAPSULE BY MOUTH TWICE DAILY FOR ANXIETY/CHRONIC PAIN  lamoTRIgine (LAMICTAL) 200 mg Oral Tablet Take 1 Tablet (200 mg total) by mouth Twice daily for 90 days Indications: bipolar depression    LORazepam (ATIVAN) 0.5 mg Oral Tablet Take 1 Tablet (0.5 mg total) by mouth Twice per day as needed for Anxiety for up to 90 days Indications: anxious    metoprolol tartrate (LOPRESSOR) 50 mg Oral Tablet Take 1 Tablet (50 mg total) by mouth Twice daily    QUEtiapine (SEROQUEL) 150 mg Oral Tablet Take 1 Tablet (150 mg total) by mouth Every night for 90 days Indications: bipolar depression    traZODone (DESYREL) 50 mg Oral Tablet Take 1 Tablet (50 mg total) by mouth Every night as needed for  Insomnia for up to 90 days Indications: insomnia associated with depression    ziprasidone (GEODON) 40 mg Oral Capsule Take 1 Capsule (40 mg total) by mouth Every night for 90 days Indications: bipolar depression, insomnia        Symptom Description/Subjective Report: Cody Mccoy presents today for follow-up of Bipolar II, GAD, PTSD, ct reports that his depression is a 6 and her anxiety is a 6 as well on a likert scale where 10 is the highest acuity. Cody Mccoy reports that he has been in more pain due to his back and is more depressed and anxious by not doing the things he enjoys or taking longer to do things he enjoys. He reports he is less tolerant of his father and they have been bickering and he has lashed back at him but also made the choice to stay away when he is less tolerant.    Objective Content: Clinician completed a check-in with the ct regarding sx's and and reviewed with him the window of tolerance and how it can be shortened when someone is in pain. Clinician redirected ct from taking "jabs" at his father by making inflammatory comments to him which will lead to conflict. And praised ct for staying away if he is unable to tolerate his father and opt to ignore some snide remarks and not comment back. Ct was agreeable to working on this the next few weeks.    Interventions Used:   Motivational Interviewing, Psychologist, clinical, and Communication Skills      Goal Progress: progressing    Additional Notes / Assessment:     Recommendation: Continue current therapeutic focus.    Plan: Cody Mccoy will benefit from regular psychotherapy. Patient will return to this clinic in 3 week(s) for follow-up.    Emeterio Reeve, Licensed Professional Counselor  05/22/2023, 10:06

## 2023-06-05 ENCOUNTER — Other Ambulatory Visit: Payer: Self-pay

## 2023-06-05 ENCOUNTER — Ambulatory Visit: Payer: Self-pay | Attending: COUNSELOR-PROFESSIONAL | Admitting: COUNSELOR-PROFESSIONAL

## 2023-06-05 DIAGNOSIS — F4320 Adjustment disorder, unspecified: Secondary | ICD-10-CM

## 2023-06-05 DIAGNOSIS — F411 Generalized anxiety disorder: Secondary | ICD-10-CM

## 2023-06-05 DIAGNOSIS — F4310 Post-traumatic stress disorder, unspecified: Secondary | ICD-10-CM

## 2023-06-05 DIAGNOSIS — F3181 Bipolar II disorder: Secondary | ICD-10-CM

## 2023-06-05 NOTE — Progress Notes (Signed)
 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 Per observation and interview with patient   Recent Memory: grossly normal  Remote Memory: grossly normal  How was the patient's Remote Memory Tested/Assessed? Past Events, as it relates to history  Intelligence/Fund of Knowledge:  Average  How was the patient's Intelligence/Fund of Knowledge Tested/Assessed? based on history.  Judgement: Fair  How was the patient's Judgement Tested/Assessed? Per patient's behavior/history of present illness  Insight: Adequate  How was the patient's Insight Tested/Assessed? Understanding of severity of illness/history of present illness     Risk Assessment:     Based upon patient presentation and therapist observation during session, patient appears not to be SI/HI.  In the event of increase in SI or HI, I recommended the patient use the following resources to  reduce risk of harm to self or others.  Call 9-1-1  Go to the nearest emergency room  Call the National Suicide Prevention Lifeline (988)  Suicide Prevention Lifeline Online Chat - [email or web address]  Crisis Call: [phone] or (304) 325-HOPE    [x]  Client denies all areas of risk. No contrary clinical indications present.   Area of Risk:     Level of Risk:      Intent to Act:    Plan to Act:    Means to Act:  []  Low      []  Yes     []  Yes     []  Yes  []  Medium     []  No     []  No     []  No  []  High     [x]  Not Applicable   [x]  Not Applicable   [x]  Not Applicable  []  Imminent    Risk Factors:   Protective Factors:     Current Outpatient Medications   Medication Sig    busPIRone (BUSPAR) 30 mg Oral Tablet Take 1 Tablet (30 mg total) by mouth Twice daily for 90 days Indications: repeated episodes of anxiety    Cholecalciferol, Vitamin D3, (VITAMIN D) 25 mcg (1,000 unit) Oral Capsule Take 1 Capsule (1,000 Units total) by mouth Once a day    DULoxetine (CYMBALTA DR) 30 mg Oral Capsule, Delayed Release(E.C.) Take 3 Capsules (90 mg total) by mouth Once a day for 90 days Indications: anxiousness associated with depression    gabapentin (NEURONTIN) 300 mg Oral Capsule TAKE 1 CAPSULE BY MOUTH TWICE DAILY FOR ANXIETY/CHRONIC PAIN    lamoTRIgine (LAMICTAL) 200 mg Oral Tablet Take  1 Tablet (200 mg total) by mouth Twice daily for 90 days Indications: bipolar depression    LORazepam (ATIVAN) 0.5 mg Oral Tablet Take 1 Tablet (0.5 mg total) by mouth Twice per day as needed for Anxiety for up to 90 days Indications: anxious    metoprolol tartrate (LOPRESSOR) 50 mg Oral Tablet Take 1 Tablet (50 mg total) by mouth Twice daily    QUEtiapine (SEROQUEL) 150 mg Oral Tablet Take 1 Tablet (150 mg total) by mouth Every night for 90 days Indications: bipolar depression    traZODone (DESYREL) 50 mg Oral Tablet Take 1 Tablet (50 mg total) by mouth Every night as needed for Insomnia for up to 90 days Indications:  insomnia associated with depression    ziprasidone (GEODON) 40 mg Oral Capsule Take 1 Capsule (40 mg total) by mouth Every night for 90 days Indications: bipolar depression, insomnia        Symptom Description/Subjective Report: Cody Mccoy presents today for follow-up of Bipolar II, GAD, PTSD, ct reports his anxiety is a 7 and his depression is a 6 on a likert scale where 10 is the highest acuity. He indicates that his depression and anxiety is higher because of his pain and not being able to do the things that he enjoyed before, or in the same amount of time. He was able to reflect on last session where he sometimes instigates things with his father, he reports he has been ignoring him more although he does feel his father often tries to poke at him so to speak. He is worried about his father thinking he is deteriorating and is not looking well as if "he's given up" and he has been pushing his father to go do things and he won't. Is worried about upcoming appt with his back surgeon.    Objective Content: Clinician completed a check in with the ct regarding sx's and praised him for acknowledging that he is his worse critic. Clinician explained to ct that his frustrations are stemming from negative thoughts about him self and aided ct to work toward accepting that his pain is what it is, and is inhibiting him from doing what he needs to only temporarily and to give himself a break that his daily functioning looks different and that's okay. Will continue to monitor self-talk and talk with his father to keep his frustration tolerance at the appropriate level.    Interventions Used:   Motivational Interviewing, Psychologist, clinical, and Cognitive Reframing      Goal Progress: progressing    Additional Notes / Assessment:     Recommendation: Continue current therapeutic focus.    Plan: Cody Mccoy will benefit from regular psychotherapy. Patient will return to this clinic in 3 week(s) for follow-up.    Emeterio Reeve,  Licensed Professional Counselor  06/05/2023, 10:25

## 2023-06-07 ENCOUNTER — Ambulatory Visit: Payer: Self-pay | Attending: NURSE PRACTITIONER | Admitting: NURSE PRACTITIONER

## 2023-06-07 ENCOUNTER — Other Ambulatory Visit: Payer: Self-pay

## 2023-06-07 ENCOUNTER — Encounter (HOSPITAL_PSYCHIATRIC): Payer: Self-pay | Admitting: NURSE PRACTITIONER

## 2023-06-07 VITALS — BP 133/77 | HR 85 | Resp 18 | Ht 71.0 in | Wt 315.0 lb

## 2023-06-07 DIAGNOSIS — F3189 Other bipolar disorder: Secondary | ICD-10-CM

## 2023-06-07 DIAGNOSIS — F4310 Post-traumatic stress disorder, unspecified: Secondary | ICD-10-CM | POA: Insufficient documentation

## 2023-06-07 DIAGNOSIS — F4320 Adjustment disorder, unspecified: Secondary | ICD-10-CM | POA: Insufficient documentation

## 2023-06-07 DIAGNOSIS — F3181 Bipolar II disorder: Secondary | ICD-10-CM | POA: Insufficient documentation

## 2023-06-07 DIAGNOSIS — F411 Generalized anxiety disorder: Secondary | ICD-10-CM | POA: Insufficient documentation

## 2023-06-07 MED ORDER — QUETIAPINE 100 MG TABLET
ORAL_TABLET | ORAL | 0 refills | Status: AC
Start: 2023-06-07 — End: ?

## 2023-06-07 MED ORDER — BUSPIRONE 30 MG TABLET
30.0000 mg | ORAL_TABLET | Freq: Two times a day (BID) | ORAL | 2 refills | Status: AC
Start: 2023-06-07 — End: 2023-09-05

## 2023-06-07 MED ORDER — GABAPENTIN 300 MG CAPSULE
300.0000 mg | ORAL_CAPSULE | Freq: Two times a day (BID) | ORAL | 0 refills | Status: AC
Start: 2023-06-07 — End: 2023-09-05

## 2023-06-07 MED ORDER — LORAZEPAM 0.5 MG TABLET
0.5000 mg | ORAL_TABLET | Freq: Two times a day (BID) | ORAL | 2 refills | Status: AC | PRN
Start: 2023-06-07 — End: 2023-09-05

## 2023-06-07 MED ORDER — ZIPRASIDONE 60 MG CAPSULE
60.0000 mg | ORAL_CAPSULE | Freq: Every evening | ORAL | 2 refills | Status: DC
Start: 2023-06-07 — End: 2023-09-08

## 2023-06-07 MED ORDER — DULOXETINE 30 MG CAPSULE,DELAYED RELEASE
90.0000 mg | DELAYED_RELEASE_CAPSULE | Freq: Every day | ORAL | 2 refills | Status: AC
Start: 2023-06-07 — End: 2023-09-05

## 2023-06-07 MED ORDER — LAMOTRIGINE 200 MG TABLET
200.0000 mg | ORAL_TABLET | Freq: Two times a day (BID) | ORAL | 2 refills | Status: DC
Start: 2023-06-07 — End: 2023-09-08

## 2023-06-07 MED ORDER — TRAZODONE 50 MG TABLET
50.0000 mg | ORAL_TABLET | Freq: Every evening | ORAL | 2 refills | Status: AC | PRN
Start: 2023-06-07 — End: 2023-09-05

## 2023-06-07 NOTE — Patient Instructions (Addendum)
 Take medications only as prescribed. Keep all medical appointments to avoid care interruption. Follow up with your primary care provider (PCP) regularly and obtain blood labs annually.     Your Medications have changed: Taper off of Seroquel take 100 mg by mouth daily for 1 month, then take 50 mg by mouth daily for one month, then 25 mg by mouth daily for 2 weeks, then skip days until completed and stop. Increase Geodon 60 mg by mouth at bedtime, continue trazodone 50 mg by mouth at bedtime for sleep, ativan 0.5mg  by mouth daily prn, lamotrigine 200 mg by mouth bid, Cymbalta 90 mg by mouth daily and Buspar 30 mg by mouth bid. Follow up in 3 months.    What to do when medication does not work:...  Wait! Wait! Wait!  Taken lining is nighttime insomnia take at night if daytime sedation with you we or month before switching to another agent or adding other medications   Avoid mixing medications with drugs and alcohol.     Call the [facility] office if symptoms worsen or problems arise.  [phone].    *NOTE: It is best to consult your mental health provider before stopping medications.   When stopping mental health medications it is best to taper the medication to avoid withdrawal effects: dizziness, nausea, stomach cramps, sweating, tingling, or flu-like withdrawal symptoms, that  emerge during discontinuation. If these symptoms occur, residual or restart the medication to stop symptoms and then restart to taper down much more slowly.      Withdrawal symptoms can be more common or more severe with Effexor than some other antidepressants.  Abrupt discontinuation of fluoxetine/ Prozac because of the long half life, generally does not cause withdrawal symptoms.There is no need to taper stimulants, modafinil, buspirone, or hydroxyzine. Advised and educated against using alcohol, illicit substances, cannabis, or misusing prescription medications while on psychotropic medication due to risk of negative  interactions. Client voiced understanding that combining alcohol, illicit substances, cannabis, or misusing prescription medications will be at his/her own risk.

## 2023-06-07 NOTE — Progress Notes (Signed)
 Progress Note    Patient's Full Name: Cody Mccoy   Patient's Date of Birth: [birth year]   Patient's Age: 43 y.o.   Patient's Legal Sex: male   Patient's MRN: [id number]     Current Date: 06/07/2023 12:57       CC: " doing well "     History of Present Illness:Bryam Malmstrom 43 y.o. White male presents for medication management. Last appointment we continue medication, follow up in 3 months     Today client relays " I have been having a lot of back problems. 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 Has filed for disability, can't bend, numb feet, numb spot on my thigh. I want to decrease Seroquel down and increase Geodon.    "Alcario Drought thinks that I have ADHD and Autism, she wants me to be tested." Requesting ADHD and ASD testing.    Denies suicidal, homicidal and self-harm thoughts or intentions. [employer]  denies auditory, visual, tactile hallucinations, no delusions, no paranoia.  Reports sleep as good, appetite adequate and mood as stable. Denies adverse reactions to medications. Verbal consent to treat given. Reviewed treatment plan with client.  Denies questions or further concerns at this time.  Advised to contact the [facility] as needed for appointments, concerns, questions, or medication refills.  Vitals:    06/07/23 1252   BP: 133/77   Pulse: 85   Resp: 18   Weight: (!) 143 kg (315 lb)   Height: 1.803 m (5\' 11" )   BMI: 43.93       During this visit we completed the PHQ9 ( 7);       Plan: Treatment and Medication Recommendation: Taper off of Seroquel take 100 mg by mouth daily for 1 month, then take 50 mg by mouth daily for one month, then 25 mg by mouth daily for 2 weeks, then skip days until completed and stop. Increase Geodon 60 mg by mouth at bedtime, continue trazodone 50 mg by mouth at bedtime for sleep, ativan 0.5mg  by mouth daily prn, lamotrigine 200 mg by mouth bid, Cymbalta 90 mg by mouth daily and Buspar 30 mg by mouth bid. Follow up in 3 months.  Medications will be prescribed and adjusted as indicated by reported symptoms and medication efficacy. Advised and educated against using alcohol, illicit substances, cannabis, or misusing prescription medications while on psychotropic medication due to risk of negative interactions. Client voiced understanding that combining alcohol, illicit substances, cannabis, or misusing prescription medications will be at his/her own risk.    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 Advised to contact provider with complaints, reports, questions, needs, or concerns. Discussed importance of taking medication only as prescribed and keeping all medical and mental health appointments.    We have discussed medication adverse-effects, treatment options, and pregnancy precautions if applicable.  The patient verbalizes agreement with this plan. Client agrees with the plan and will be encouraged to contact provider with questions and concerns. Patient has been instructed follow up plan. Further changes will be dictated by clinical course.    Thomasenia Sales, APRN,PMHNP-BC  [facility]

## 2023-06-26 ENCOUNTER — Ambulatory Visit (HOSPITAL_PSYCHIATRIC): Payer: Self-pay | Admitting: COUNSELOR-PROFESSIONAL

## 2023-07-04 ENCOUNTER — Other Ambulatory Visit (HOSPITAL_PSYCHIATRIC): Payer: Self-pay | Admitting: NURSE PRACTITIONER

## 2023-07-04 NOTE — Telephone Encounter (Signed)
 Refill too soon

## 2023-07-06 ENCOUNTER — Other Ambulatory Visit (HOSPITAL_PSYCHIATRIC): Payer: Self-pay | Admitting: Psychiatry

## 2023-07-06 MED ORDER — LORAZEPAM 0.5 MG TABLET
0.5000 mg | ORAL_TABLET | Freq: Two times a day (BID) | ORAL | 0 refills | Status: AC | PRN
Start: 2023-07-06 — End: 2023-08-05

## 2023-07-06 MED ORDER — GABAPENTIN 300 MG CAPSULE
300.0000 mg | ORAL_CAPSULE | Freq: Two times a day (BID) | ORAL | 0 refills | Status: AC
Start: 2023-07-06 — End: 2023-08-05

## 2023-07-06 NOTE — Telephone Encounter (Signed)
 [facility] called and patient needs a new prescription sent in for his Neurontin and Ativan because refills can't be filled from [place] where her DEA has lapsed.  I built the prescriptions if you don't mind to approve or deny. Thank You

## 2023-07-14 ENCOUNTER — Ambulatory Visit: Attending: COUNSELOR-PROFESSIONAL | Admitting: COUNSELOR-PROFESSIONAL

## 2023-07-14 ENCOUNTER — Other Ambulatory Visit: Payer: Self-pay

## 2023-07-14 DIAGNOSIS — F4310 Post-traumatic stress disorder, unspecified: Secondary | ICD-10-CM

## 2023-07-14 DIAGNOSIS — F3181 Bipolar II disorder: Secondary | ICD-10-CM

## 2023-07-14 DIAGNOSIS — F4320 Adjustment disorder, unspecified: Secondary | ICD-10-CM

## 2023-07-14 DIAGNOSIS — F411 Generalized anxiety disorder: Secondary | ICD-10-CM

## 2023-07-14 NOTE — Progress Notes (Signed)
 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    (F43.10) PTSD (post-traumatic stress disorder)       Mental Status Examination:  Sensorium/Alertness: Alert  Orientation: Date , Person, Place, and Situation  Appearance: in street clothes appropriately groomed  Psychomotor Activity: Normal  Abnormal Behaviors: None  Attitude Towards Examiner: Attentive  Eye Contact: Normal  Speech: normal  Mood: appropriate to circumstances  Affect: Appropriate  Perception: normal  Though Process: linear/logical  Thought Content: Within Normal Limits  Impulse Control: Good  Concentration/Calculation/Attention Span: within normal limits  How was the patient's Concentration/Calculation/Attention tested/assessed? Per observation and interview with patient   Recent Memory: grossly normal  Remote Memory: grossly normal  How was the patient's Remote Memory Tested/Assessed? Past Events, as it relates to history  Intelligence/Fund of Knowledge:  Average  How was the patient's Intelligence/Fund of Knowledge Tested/Assessed? based on history.  Judgement: Fair  How was the patient's Judgement Tested/Assessed? Per patient's behavior/history of present illness  Insight: Adequate  How was the patient's Insight Tested/Assessed? Understanding of severity of illness/history of present illness     Risk Assessment:     Based upon patient presentation and therapist observation during session, patient appears not to be SI/HI.  In the event of increase in SI or HI, I recommended the patient use the following  resources to reduce risk of harm to self or others.  Call 9-1-1  Go to the nearest emergency room  Call the National Suicide Prevention Lifeline (988)  Suicide Prevention Lifeline Online Chat - [email or web address].pt  Crisis Call: (434)684-4269 or (304) 325-HOPE    [x]  Client denies all areas of risk. No contrary clinical indications present.   Area of Risk:     Level of Risk:      Intent to Act:    Plan to Act:    Means to Act:  [x]  Low      []  Yes     []  Yes     []  Yes  []  Medium     []  No     []  No     []  No  []  High     [x]  Not Applicable   [x]  Not Applicable   [x]  Not Applicable  []  Imminent    Risk Factors:   Protective Factors:     Current Outpatient Medications   Medication Sig    busPIRone  (BUSPAR ) 30 mg Oral Tablet Take 1 Tablet (30 mg total) by mouth Twice daily for 90 days Indications: repeated episodes of anxiety    Cholecalciferol, Vitamin D3, (VITAMIN D) 25 mcg (1,000 unit) Oral Capsule Take 1 Capsule (1,000 Units total) by mouth Once a day    DULoxetine  (CYMBALTA  DR) 30 mg Oral Capsule, Delayed Release(E.C.) Take 3 Capsules (90 mg total) by mouth Once a day for 90 days Indications: anxiousness associated with depression    gabapentin  (NEURONTIN ) 300 mg Oral Capsule Take 1 Capsule (300 mg total) by mouth Twice daily for 30 days    lamoTRIgine  (LAMICTAL )  200 mg Oral Tablet Take 1 Tablet (200 mg total) by mouth Twice daily for 90 days Indications: bipolar depression    LORazepam  (ATIVAN ) 0.5 mg Oral Tablet Take 1 Tablet (0.5 mg total) by mouth Twice per day as needed for Anxiety for up to 30 days Indications: anxious    metoprolol  tartrate (LOPRESSOR ) 50 mg Oral Tablet Take 1 Tablet (50 mg total) by mouth Twice daily    QUEtiapine  (SEROQUEL ) 100 mg Oral Tablet Take 1 tablet by mouth for 4 weeks then take 1/2 tablet for 1 month and discontinue. Indications: bipolar depression    traZODone  (DESYREL ) 50 mg Oral Tablet Take 1 Tablet (50 mg total) by mouth Every night as needed for  Insomnia for up to 90 days Indications: insomnia associated with depression    ziprasidone  (GEODON ) 60 mg Oral Capsule Take 1 Capsule (60 mg total) by mouth Every night for 90 days Indications: bipolar depression, insomnia        Symptom Description/Subjective Report: Cody Mccoy presents today for follow-up of Bipolar II, GAD, PTSD; drastically reduced anxiety from last session to a 4 out of 10 and zero depression; he was having some worried and depressive about seeing his back doctor and worried about having surgery but has had a medication change that has allowed him to sleep. He did get into an argument with his father this week and felt like he wanted to "push him down" but was able to curb his attitude and walk away, which is an improvement.    Objective Content: Clinician completed a check-in with the client and praised him for knowing his limits. He also feels like his depression has improvement. He does worry about his fathers health, but it is appropriate worry. Clinician encouraged ct to continue exercise and seek out activities.     Interventions Used:   Motivational Interviewing and Supportive Reflection      Goal Progress: In Progress / Ongoing    Recommendation: Continue current therapeutic focus.    Plan: Cody Mccoy will benefit from regular psychotherapy. Patient will return to this clinic in 4 week(s) for follow-up.    Stephannie Ehlers, Licensed Professional Counselor  07/14/2023, 11:45

## 2023-08-14 ENCOUNTER — Other Ambulatory Visit: Payer: Self-pay

## 2023-08-14 ENCOUNTER — Ambulatory Visit: Payer: Self-pay | Attending: COUNSELOR-PROFESSIONAL | Admitting: COUNSELOR-PROFESSIONAL

## 2023-08-14 DIAGNOSIS — F411 Generalized anxiety disorder: Secondary | ICD-10-CM

## 2023-08-14 DIAGNOSIS — F3181 Bipolar II disorder: Secondary | ICD-10-CM

## 2023-08-14 DIAGNOSIS — F4320 Adjustment disorder, unspecified: Secondary | ICD-10-CM

## 2023-08-14 DIAGNOSIS — F4310 Post-traumatic stress disorder, unspecified: Secondary | ICD-10-CM

## 2023-08-14 NOTE — Progress Notes (Signed)
 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       Mental Status Examination:  Sensorium/Alertness: Alert  Orientation: Date , Person, Place, and Situation  Appearance: in street clothes appropriately groomed  Psychomotor Activity: Normal  Abnormal Behaviors: None  Attitude Towards Examiner: Cooperative  Eye Contact: Normal  Speech: normal  Mood: appropriate to circumstances  Affect: Appropriate  Perception: normal  Though Process: linear/logical  Thought Content: Within Normal Limits  Impulse Control: Fair  Concentration/Calculation/Attention Span: within normal limits  How was the patient's Concentration/Calculation/Attention tested/assessed? Per observation and interview with patient   Recent Memory: grossly normal  Remote Memory: grossly normal  How was the patient's Remote Memory Tested/Assessed? Past Events, as it relates to history  Intelligence/Fund of Knowledge:  Average  How was the patient's Intelligence/Fund of Knowledge Tested/Assessed? based on history.  Judgement: Fair  How was the patient's Judgement Tested/Assessed? Per patient's behavior/history of present illness  Insight: Adequate  How was the patient's Insight Tested/Assessed? Understanding of severity of illness/history of present illness     Risk Assessment:     Based upon patient presentation and therapist observation during session, patient appears not to be SI/HI.  In the event of increase in SI or HI, I recommended the patient use the following  resources to reduce risk of harm to self or others.  Call 9-1-1  Go to the nearest emergency room  Call the National Suicide Prevention Lifeline (988)  Suicide Prevention Lifeline Online Chat - [email or web address].pt  Crisis Call: (680) 801-9417 or (304) 325-HOPE    [x]  Client denies all areas of risk. No contrary clinical indications present.   Area of Risk:     Level of Risk:      Intent to Act:    Plan to Act:    Means to Act:  [x]  Low      []  Yes     []  Yes     []  Yes  []  Medium     []  No     []  No     []  No  []  High     [x]  Not Applicable   [x]  Not Applicable   [x]  Not Applicable  []  Imminent    Risk Factors:   Protective Factors:     Current Outpatient Medications   Medication Sig    busPIRone  (BUSPAR ) 30 mg Oral Tablet Take 1 Tablet (30 mg total) by mouth Twice daily for 90 days Indications: repeated episodes of anxiety    Cholecalciferol, Vitamin D3, (VITAMIN D) 25 mcg (1,000 unit) Oral Capsule Take 1 Capsule (1,000 Units total) by mouth Once a day    DULoxetine  (CYMBALTA  DR) 30 mg Oral Capsule, Delayed Release(E.C.) Take 3 Capsules (90 mg total) by mouth Once a day for 90 days Indications: anxiousness associated with depression    gabapentin  (NEURONTIN ) 300 mg Oral Capsule Take 1 Capsule (300 mg total) by mouth Twice daily for 30 days    lamoTRIgine  (LAMICTAL )  200 mg Oral Tablet Take 1 Tablet (200 mg total) by mouth Twice daily for 90 days Indications: bipolar depression    metoprolol  tartrate (LOPRESSOR ) 50 mg Oral Tablet Take 1 Tablet (50 mg total) by mouth Twice daily    QUEtiapine  (SEROQUEL ) 100 mg Oral Tablet Take 1 tablet by mouth for 4 weeks then take 1/2 tablet for 1 month and discontinue. Indications: bipolar depression    traZODone  (DESYREL ) 50 mg Oral Tablet Take 1 Tablet (50 mg total) by mouth Every night as needed for Insomnia for up to 90 days Indications: insomnia associated with depression    ziprasidone  (GEODON ) 60 mg Oral Capsule Take 1 Capsule (60 mg total) by mouth  Every night for 90 days Indications: bipolar depression, insomnia        Symptom Description/Subjective Report: Jesstin Studstill presents today for follow-up of Bipolar, GAD, PTSD, Situational Stress; reports his Anxiety and Depression as a 5 out of 10, he stated that he feels it's because losing some sleep and being told that he definitely is unable to work due to his back. He is still processing it although it was the news he expected. He states he is doing well with his Dad, although he did get snippy with him yesterday.    Objective Content: Clinician completed a check-in with the ct and due to improved sx's continued to discuss potential discharge in next session. Ct reports as of now he feels he's ready but wants to see how the next month goes. Ct reports that he is doing well with his anger and anxiety and depression.    Interventions Used:   Motivational Interviewing and Supportive Reflection      Goal Progress: In Progress / Ongoing    Recommendation: Continue current therapeutic focus.    Plan: Jakai Risse will benefit from regular psychotherapy. Patient will return to this clinic in 4 week(s) for follow-up to assess for discharge.    Stephannie Ehlers, Licensed Professional Counselor  08/14/2023, 09:14

## 2023-09-07 ENCOUNTER — Ambulatory Visit (HOSPITAL_PSYCHIATRIC): Payer: Self-pay

## 2023-09-08 ENCOUNTER — Other Ambulatory Visit (HOSPITAL_PSYCHIATRIC): Payer: Self-pay | Admitting: Psychiatry

## 2023-09-08 MED ORDER — ZIPRASIDONE 60 MG CAPSULE
60.0000 mg | ORAL_CAPSULE | Freq: Every evening | ORAL | 0 refills | Status: AC
Start: 2023-09-08 — End: 2023-10-08

## 2023-09-08 MED ORDER — LAMOTRIGINE 200 MG TABLET
200.0000 mg | ORAL_TABLET | Freq: Two times a day (BID) | ORAL | 0 refills | Status: AC
Start: 2023-09-08 — End: 2023-10-08

## 2023-09-08 NOTE — Telephone Encounter (Signed)
 Previous patient of [facility]. Not scheduled to see Shelvy Dickens until 6/20 but is out of medications. If you don't mind to approve or deny. Thanks

## 2023-09-14 ENCOUNTER — Ambulatory Visit: Payer: Self-pay | Attending: COUNSELOR-PROFESSIONAL | Admitting: COUNSELOR-PROFESSIONAL

## 2023-09-14 ENCOUNTER — Other Ambulatory Visit: Payer: Self-pay

## 2023-09-14 DIAGNOSIS — F3181 Bipolar II disorder: Secondary | ICD-10-CM

## 2023-09-14 DIAGNOSIS — F411 Generalized anxiety disorder: Secondary | ICD-10-CM

## 2023-09-14 DIAGNOSIS — F4320 Adjustment disorder, unspecified: Secondary | ICD-10-CM

## 2023-09-14 DIAGNOSIS — F4310 Post-traumatic stress disorder, unspecified: Secondary | ICD-10-CM

## 2023-09-14 NOTE — Progress Notes (Signed)
 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       Mental Status Examination:  Sensorium/Alertness: Alert  Orientation: Date , Person, Place, and Situation  Appearance: in street clothes appropriately groomed  Psychomotor Activity: Normal  Abnormal Behaviors: None  Attitude Towards Examiner: Cooperative  Eye Contact: Normal  Speech: normal  Mood: appropriate to circumstances  Affect: Appropriate  Perception: normal  Though Process: linear/logical  Thought Content: Within Normal Limits  Impulse Control: Fair  Concentration/Calculation/Attention Span: within normal limits  How was the patient's Concentration/Calculation/Attention tested/assessed? Per observation and interview with patient   Recent Memory: grossly normal  Remote Memory: grossly normal  How was the patient's Remote Memory Tested/Assessed? Past Events, as it relates to history  Intelligence/Fund of Knowledge:  Average  How was the patient's Intelligence/Fund of Knowledge Tested/Assessed? based on history.  Judgement: Fair  How was the patient's Judgement Tested/Assessed? Per patient's behavior/history of present illness  Insight: Adequate  How was the patient's Insight Tested/Assessed? Understanding of severity of illness/history of present illness     Risk Assessment:     Based upon patient presentation and therapist observation during session, patient appears not to be SI/HI.  In the event of increase in SI or HI, I recommended the patient use the following  resources to reduce risk of harm to self or others.  Call 9-1-1  Go to the nearest emergency room  Call the National Suicide Prevention Lifeline (988)  Suicide Prevention Lifeline Online Chat - [email or web address].pt  Crisis Call: [phone] or (304) 325-HOPE    [x]  Client denies all areas of risk. No contrary clinical indications present.   Area of Risk:     Level of Risk:      Intent to Act:    Plan to Act:    Means to Act:  [x]  Low      []  Yes     []  Yes     []  Yes  []  Medium     []  No     []  No     []  No  []  High     [x]  Not Applicable   [x]  Not Applicable   [x]  Not Applicable  []  Imminent    Risk Factors:   Protective Factors:     Current Outpatient Medications   Medication Sig    busPIRone  (BUSPAR ) 30 mg Oral Tablet Take 1 Tablet (30 mg total) by mouth Twice daily for 90 days Indications: repeated episodes of anxiety    Cholecalciferol, Vitamin D3, (VITAMIN D) 25 mcg (1,000 unit) Oral Capsule Take 1 Capsule (1,000 Units total) by mouth Once a day    DULoxetine  (CYMBALTA  DR) 30 mg Oral Capsule, Delayed Release(E.C.) Take 3 Capsules (90 mg total) by mouth Once a day for 90 days Indications: anxiousness associated with depression    gabapentin  (NEURONTIN ) 300 mg Oral Capsule Take 1 Capsule (300 mg total) by mouth Twice daily for 30 days    lamoTRIgine  (LAMICTAL )  200 mg Oral Tablet Take 1 Tablet (200 mg total) by mouth Twice daily for 30 days Indications: bipolar depression    metoprolol  tartrate (LOPRESSOR ) 50 mg Oral Tablet Take 1 Tablet (50 mg total) by mouth Twice daily    QUEtiapine  (SEROQUEL ) 100 mg Oral Tablet Take 1 tablet by mouth for 4 weeks then take 1/2 tablet for 1 month and discontinue. Indications: bipolar depression    traZODone  (DESYREL ) 50 mg Oral Tablet Take 1 Tablet (50 mg total) by mouth Every night as needed for Insomnia for up to 90 days Indications: insomnia associated with depression    ziprasidone  (GEODON ) 60 mg Oral Capsule Take 1 Capsule (60 mg total) by mouth  Every night for 30 days Indications: bipolar depression, insomnia        Symptom Description/Subjective Report: Cody Mccoy presents today for follow-up of the above mentioned diagnoses. Reports his anxiety and depression today is a 6 out of 10, reports he is not sleeping well anymore, and is having difficulty getting up out of bed. Recently broke up with this gf due to an argument with her uncle at church, reports she doesn't feel safe around him because he takes up for himself. Is getting along better with Dad but reports that he is having trouble accepting his back pain for what it is as it limits what he was able to do before. Has lost 20lbs since last session doing low-carb diet.     Objective Content: Clinician completed a check-in with the ct and reviewed his coping skills for his anxiety and depression as being sitting outside and watching the critters, drink herbal tea, and breathing skills reports he gets foggy headed and disoriented in the morning when he wakes up, and increasing anxiety when he wakes. 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 Clinician aided ct in exploring his overwhelmed coping and not discharging from therapy due to potential med changes that could affect his mood. Praised ct for improvements, will work on Physicist, medical in next session.    Interventions Used:   Psycho-Education and Motivational Interviewing      Goal Progress: In Progress / Ongoing    Recommendation: Continue current therapeutic focus.    Plan: Cody Mccoy will benefit from regular psychotherapy. Patient will return to this clinic in 3 week(s) for follow-up to start working on accepting his feelings etc., related to limitations.    Stephannie Ehlers, Licensed Professional Counselor  09/14/2023, 08:21

## 2023-09-18 ENCOUNTER — Ambulatory Visit: Payer: Self-pay

## 2023-09-18 ENCOUNTER — Other Ambulatory Visit: Payer: Self-pay

## 2023-09-18 ENCOUNTER — Encounter (HOSPITAL_PSYCHIATRIC): Payer: Self-pay

## 2023-09-18 ENCOUNTER — Other Ambulatory Visit (HOSPITAL_PSYCHIATRIC): Payer: Self-pay | Admitting: Psychiatry

## 2023-09-18 VITALS — BP 141/93 | HR 72 | Resp 18 | Ht 71.0 in | Wt 304.0 lb

## 2023-09-18 DIAGNOSIS — F411 Generalized anxiety disorder: Secondary | ICD-10-CM | POA: Insufficient documentation

## 2023-09-18 DIAGNOSIS — G4700 Insomnia, unspecified: Secondary | ICD-10-CM | POA: Insufficient documentation

## 2023-09-18 DIAGNOSIS — F3181 Bipolar II disorder: Secondary | ICD-10-CM | POA: Insufficient documentation

## 2023-09-18 DIAGNOSIS — Z814 Family history of other substance abuse and dependence: Secondary | ICD-10-CM

## 2023-09-18 DIAGNOSIS — F4310 Post-traumatic stress disorder, unspecified: Secondary | ICD-10-CM | POA: Insufficient documentation

## 2023-09-18 MED ORDER — LORAZEPAM 1 MG TABLET
1.0000 mg | ORAL_TABLET | Freq: Every day | ORAL | 0 refills | Status: DC | PRN
Start: 2023-09-18 — End: 2023-10-18

## 2023-09-18 MED ORDER — ZALEPLON 10 MG CAPSULE
10.0000 mg | ORAL_CAPSULE | Freq: Every evening | ORAL | 0 refills | Status: DC
Start: 2023-09-18 — End: 2023-09-20

## 2023-09-18 MED ORDER — HYDROXYZINE PAMOATE 50 MG CAPSULE
50.0000 mg | ORAL_CAPSULE | Freq: Three times a day (TID) | ORAL | 0 refills | Status: DC | PRN
Start: 2023-09-18 — End: 2023-11-16

## 2023-09-18 MED ORDER — DULOXETINE 30 MG CAPSULE,DELAYED RELEASE
90.0000 mg | DELAYED_RELEASE_CAPSULE | Freq: Every day | ORAL | 0 refills | Status: DC
Start: 2023-09-18 — End: 2023-10-18

## 2023-09-18 MED ORDER — LAMOTRIGINE 200 MG TABLET
200.0000 mg | ORAL_TABLET | Freq: Two times a day (BID) | ORAL | 0 refills | Status: DC
Start: 2023-09-18 — End: 2023-10-18

## 2023-09-18 MED ORDER — BUSPIRONE 30 MG TABLET
30.0000 mg | ORAL_TABLET | Freq: Two times a day (BID) | ORAL | 0 refills | Status: DC
Start: 2023-09-18 — End: 2023-10-18

## 2023-09-18 MED ORDER — ZIPRASIDONE 60 MG CAPSULE
60.0000 mg | ORAL_CAPSULE | Freq: Two times a day (BID) | ORAL | 0 refills | Status: DC
Start: 2023-09-18 — End: 2023-10-18

## 2023-09-18 NOTE — Telephone Encounter (Signed)
 Patient of Jon Apa APRN PMHNP-BC, built the prescriptions from today's office note. Patient to return in 4 weeks. If you don't mind to approve or deny. Thank You

## 2023-09-18 NOTE — Patient Instructions (Signed)
 Lamotrigine  200 mg take 1 tab by mouth twice daily for Bipolar II Disorder  Buspirone  30 mg take 1 tab by mouth twice daily for anxiety  Duloxetine  DR 30 mg take 90 mg by mouth daily for anxiety, depression, and PTSD  Increase Lorazepam  from 0.5 to 1 mg take 1 tab by mouth daily as needed for anxiety  Discontinue Trazodone  50 mg take 1 tab by mouth at bedtime as needed for insomnia  Increase Ziprasidone  from 60 mg take 1 cap by mouth at bedtime to twice daily for Bipolar II Disorder  Start Hydroxyzine  Pamoate 50 mg take 1 cap by mouth three times daily as needed for anxiety (Patient currently taking this dose )  Start Zaleplon 10 mg by mouth at bedtime for insomnia
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
## 2023-09-20 ENCOUNTER — Other Ambulatory Visit (HOSPITAL_PSYCHIATRIC): Payer: Self-pay | Admitting: Psychiatry

## 2023-09-20 MED ORDER — ZOLPIDEM 5 MG TABLET
5.0000 mg | ORAL_TABLET | Freq: Every evening | ORAL | 0 refills | Status: DC | PRN
Start: 2023-09-20 — End: 2023-09-27

## 2023-09-20 NOTE — Telephone Encounter (Signed)
 [employer] will not approve the zaleplon  10 mg. It is requesting that we try a preferred agent first for thirty days. Jon Apa, APRN,PMHNP-BC would like to try zolpidem  5mg  to start. If you don't mind to review and approve or deny. Thank you.

## 2023-09-21 ENCOUNTER — Encounter (HOSPITAL_PSYCHIATRIC): Payer: Self-pay

## 2023-09-26 ENCOUNTER — Encounter (HOSPITAL_PSYCHIATRIC): Payer: Self-pay

## 2023-09-26 NOTE — Telephone Encounter (Signed)
 Patient called today and said the Ambien  has made sleeping worse. He said it isn't helping him fall asleep and if he does manage to fall asleep its like a half wake half dream crazy reality. Is wanting to know if you can write something else. Been on medication for 5 nights now. Happy to call patient back with any advice/new orders you may have. Thank You

## 2023-09-27 ENCOUNTER — Other Ambulatory Visit (HOSPITAL_PSYCHIATRIC): Payer: Self-pay | Admitting: Psychiatry

## 2023-09-27 MED ORDER — ZOLPIDEM 10 MG TABLET
10.0000 mg | ORAL_TABLET | Freq: Every evening | ORAL | 0 refills | Status: DC | PRN
Start: 2023-09-27 — End: 2023-10-18

## 2023-09-27 NOTE — Telephone Encounter (Signed)
 Patient called and said he cannot sleep with the Ambien  5mg . Jon Apa APRN PMHNP-BC wants to increase it to 10 mg nightly. If you don't mind to approve or deny. Thank You

## 2023-10-02 ENCOUNTER — Telehealth (HOSPITAL_PSYCHIATRIC): Payer: Self-pay

## 2023-10-02 NOTE — Telephone Encounter (Signed)
 Patient called and stated increasing the Ambien  to 10 mg helped so much and he was able to sleep over the weekend. He just wanted to let us  know. Thank You

## 2023-10-09 ENCOUNTER — Ambulatory Visit (HOSPITAL_PSYCHIATRIC): Payer: Self-pay | Admitting: COUNSELOR-PROFESSIONAL

## 2023-10-13 ENCOUNTER — Encounter (HOSPITAL_PSYCHIATRIC): Payer: Self-pay | Admitting: COUNSELOR-PROFESSIONAL

## 2023-10-13 NOTE — Progress Notes (Signed)
 Clinician will no longer be providing services at this practice effective 10/23/2023. Client was informed of the clinician's departure, and information regarding continuity of care--such as referral options and/or follow-up with an incoming clinician--was made available. Client is encouraged to continue therapeutic services as needed.

## 2023-10-16 ENCOUNTER — Ambulatory Visit: Payer: Self-pay | Attending: COUNSELOR-PROFESSIONAL | Admitting: COUNSELOR-PROFESSIONAL

## 2023-10-16 ENCOUNTER — Other Ambulatory Visit: Payer: Self-pay

## 2023-10-16 DIAGNOSIS — F4310 Post-traumatic stress disorder, unspecified: Secondary | ICD-10-CM

## 2023-10-16 DIAGNOSIS — F4320 Adjustment disorder, unspecified: Secondary | ICD-10-CM

## 2023-10-16 DIAGNOSIS — F3181 Bipolar II disorder: Secondary | ICD-10-CM

## 2023-10-16 DIAGNOSIS — F411 Generalized anxiety disorder: Secondary | ICD-10-CM

## 2023-10-16 NOTE — Progress Notes (Signed)
 BEHAVIORAL [facility]ICINE[facility] [place]  [address]  [place] [address]  Operated by [facility]    Name: Cody Mccoy MRN:  [id number]   Date: 10/16/2023 DOB:  [birth year] (43 y.o.)        Time In: 1050  Time Out: 1130     Diagnosis:   (F31.81) Bipolar II disorder, most recent episode major depressive (CMS HCC)  (primary encounter diagnosis)    (F43.20) Adult situational stress disorder    (F43.10) PTSD (post-traumatic stress disorder)    (F41.1) GAD (generalized anxiety disorder)       Mental Status Examination:  Sensorium/Alertness: Alert  Orientation: Date , Person, Place, and Situation  Appearance: in street clothes appropriately groomed  Psychomotor Activity: Normal  Abnormal Behaviors: None  Attitude Towards Examiner: Attentive  Eye Contact: Normal  Speech: normal  Mood: appropriate to circumstances  Affect: Appropriate  Perception: normal  Though Process: linear/logical  Thought Content: Within Normal Limits  Impulse Control: Fair  Concentration/Calculation/Attention Span: within normal limits  How was the patient's Concentration/Calculation/Attention tested/assessed? Per observation and interview with patient   Recent Memory: grossly normal  Remote Memory: grossly normal  How was the patient's Remote Memory Tested/Assessed? Past Events, as it relates to history  Intelligence/Fund of Knowledge:  Average  How was the patient's Intelligence/Fund of Knowledge Tested/Assessed? based on history.  Judgement: Fair  How was the patient's Judgement Tested/Assessed? Per patient's behavior/history of present illness  Insight: Adequate  How was the patient's Insight Tested/Assessed? Understanding of severity of illness/history of present illness     Risk Assessment:     Based upon patient presentation and therapist observation during session, patient appears not to be SI/HI.  In the event of increase in SI or HI, I recommended the patient use the following  resources to reduce risk of harm to self or others.  Call 9-1-1  Go to the nearest emergency room  Call the National Suicide Prevention Lifeline (988)  Suicide Prevention Lifeline Online Chat - [employer].pt  Crisis Call: 770 156 3231 or (304) 325-HOPE    [x]  Client denies all areas of risk. No contrary clinical indications present.   Area of Risk:     Level of Risk:      Intent to Act:    Plan to Act:    Means to Act:  [x]  Low      []  Yes     []  Yes     []  Yes  []  Medium     []  No     []  No     []  No  []  High     [x]  Not Applicable   [x]  Not Applicable   [x]  Not Applicable  []  Imminent    Risk Factors:   Protective Factors:     Current Outpatient Medications   Medication Sig    busPIRone  (BUSPAR ) 30 mg Oral Tablet Take 1 Tablet (30 mg total) by mouth Twice daily for 30 days Indications: repeated episodes of anxiety    Cholecalciferol, Vitamin D3, (VITAMIN D) 25 mcg (1,000 unit) Oral Capsule Take 1 Capsule (1,000 Units total) by mouth Once a day    DULoxetine  (CYMBALTA  DR) 30 mg Oral Capsule, Delayed Release(E.C.) Take 3 Capsules (90 mg total) by mouth Daily for 30 days Indications: anxiousness associated with depression    hydrOXYzine  pamoate (VISTARIL ) 50 mg Oral Capsule Take 1 Capsule (50 mg total) by mouth Three times a day as needed for Anxiety for up to  30 days    lamoTRIgine  (LAMICTAL ) 200 mg Oral Tablet Take 1 Tablet (200 mg total) by mouth Twice daily for 30 days Indications: bipolar depression    LORazepam  (ATIVAN ) 1 mg Oral Tablet Take 1 Tablet (1 mg total) by mouth Once per day as needed for Anxiety    metoprolol  tartrate (LOPRESSOR ) 50 mg Oral Tablet Take 1 Tablet (50 mg total) by mouth Twice daily    pregabalin (LYRICA) 150 mg Oral Capsule Take 1 Capsule (150 mg total) by mouth Twice daily    ziprasidone  (GEODON ) 60 mg Oral Capsule Take 1 Capsule (60 mg total) by mouth Twice daily for 30 days Indications: bipolar depression, insomnia    zolpidem  (AMBIEN ) 10 mg Oral  Tablet Take 1 Tablet (10 mg total) by mouth Every night as needed for Insomnia for up to 30 days        Symptom Description/Subjective Report: Cody Mccoy presents today for follow-up of above mentioned dx's. [name] reports that he has increased depression and some minor anxiety today, his anxiety reported as a 4 and depression a 6 on a likert scale where 10 is the highest acuity. He reports that his sleep has improved with the med changes. His depression is increased due to coming to the realization that he will likely never work again due to back injuries and he is having difficulty accepting that it is real now. He reports less anxiety related to his break-up with his gf I feel like a cloud has been lifted. He has upcoming appt with psych eval in order to attempt to get disability and is adjusting to the reality that he will no longer work and some stress about getting disability.    Objective Content: Clinician provided supportive therapy focused on emotional processing related to functional loss, identity shift, and disability-related stress. Validated the difficulty of accepting long-term physical limitations and explored emerging feelings of relief following the recent relationship change. Assisted patient in identifying coping strategies for managing depressive symptoms and uncertainty related to disability status. Discussed the upcoming psychiatric evaluation and encouraged continued follow-through. Clinician also informed the patient of upcoming departure from the practice, provided reassurance regarding transition of care, and reviewed options for continued treatment with another provider.    Interventions Used:   Supportive Reflection      Goal Progress: In Progress / Ongoing    Recommendation: Continue current therapeutic focus.    Plan: [name] will benefit from regular psychotherapy. Patient will return to this clinic for follow-up once referral is accepted.    Geni Molly, Licensed  Professional Counselor  10/16/2023, 10:53

## 2023-10-18 ENCOUNTER — Other Ambulatory Visit: Payer: Self-pay

## 2023-10-18 ENCOUNTER — Ambulatory Visit: Payer: Self-pay

## 2023-10-18 ENCOUNTER — Encounter (HOSPITAL_PSYCHIATRIC): Payer: Self-pay

## 2023-10-18 VITALS — BP 137/84 | HR 74 | Resp 18 | Ht 71.0 in | Wt 303.0 lb

## 2023-10-18 DIAGNOSIS — F411 Generalized anxiety disorder: Secondary | ICD-10-CM | POA: Insufficient documentation

## 2023-10-18 DIAGNOSIS — F3181 Bipolar II disorder: Secondary | ICD-10-CM | POA: Insufficient documentation

## 2023-10-18 DIAGNOSIS — F4310 Post-traumatic stress disorder, unspecified: Secondary | ICD-10-CM | POA: Insufficient documentation

## 2023-10-18 DIAGNOSIS — F4320 Adjustment disorder, unspecified: Secondary | ICD-10-CM | POA: Insufficient documentation

## 2023-10-18 DIAGNOSIS — G4700 Insomnia, unspecified: Secondary | ICD-10-CM | POA: Insufficient documentation

## 2023-10-18 MED ORDER — LORAZEPAM 1 MG TABLET
1.0000 mg | ORAL_TABLET | Freq: Every day | ORAL | 0 refills | Status: DC | PRN
Start: 2023-10-18 — End: 2023-11-16

## 2023-10-18 MED ORDER — LAMOTRIGINE 200 MG TABLET
200.0000 mg | ORAL_TABLET | Freq: Two times a day (BID) | ORAL | 0 refills | Status: DC
Start: 2023-10-18 — End: 2023-11-16

## 2023-10-18 MED ORDER — ZOLPIDEM 10 MG TABLET
10.0000 mg | ORAL_TABLET | Freq: Every evening | ORAL | 0 refills | Status: DC | PRN
Start: 2023-10-18 — End: 2023-11-16

## 2023-10-18 MED ORDER — ZIPRASIDONE 60 MG CAPSULE
60.0000 mg | ORAL_CAPSULE | Freq: Two times a day (BID) | ORAL | 0 refills | Status: DC
Start: 2023-10-18 — End: 2023-11-16

## 2023-10-18 MED ORDER — DULOXETINE 30 MG CAPSULE,DELAYED RELEASE
90.0000 mg | DELAYED_RELEASE_CAPSULE | Freq: Every day | ORAL | 0 refills | Status: DC
Start: 2023-10-18 — End: 2023-11-16

## 2023-10-18 MED ORDER — BUSPIRONE 30 MG TABLET
30.0000 mg | ORAL_TABLET | Freq: Two times a day (BID) | ORAL | 0 refills | Status: DC
Start: 2023-10-18 — End: 2023-11-16

## 2023-10-18 NOTE — Patient Instructions (Signed)
 Lamotrigine  200 mg take 1 tab by mouth twice daily for Bipolar II Disorder  Buspirone  30 mg take 1 tab by mouth twice daily for anxiety  Duloxetine  DR 30 mg take 90 mg by mouth daily for anxiety, depression, and PTSD  Lorazepam  from 1 mg take 1 tab by mouth daily as needed for anxiety  Ziprasidone  from 60 mg take 1 cap by mouth at bedtime to twice daily for Bipolar II Disorder  Hydroxyzine  Pamoate 50 mg take 1 cap by mouth three times daily as needed for anxiety   Zolpidem  10 mg by mouth at bedtime for insomnia
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
## 2023-11-16 ENCOUNTER — Encounter (HOSPITAL_PSYCHIATRIC): Payer: Self-pay

## 2023-11-16 ENCOUNTER — Ambulatory Visit: Payer: Self-pay

## 2023-11-16 ENCOUNTER — Other Ambulatory Visit: Payer: Self-pay

## 2023-11-16 VITALS — BP 127/73 | HR 82 | Resp 18 | Ht 71.0 in | Wt 306.0 lb

## 2023-11-16 DIAGNOSIS — F4320 Adjustment disorder, unspecified: Secondary | ICD-10-CM | POA: Insufficient documentation

## 2023-11-16 DIAGNOSIS — F411 Generalized anxiety disorder: Secondary | ICD-10-CM | POA: Insufficient documentation

## 2023-11-16 DIAGNOSIS — G4700 Insomnia, unspecified: Secondary | ICD-10-CM | POA: Insufficient documentation

## 2023-11-16 DIAGNOSIS — F4310 Post-traumatic stress disorder, unspecified: Secondary | ICD-10-CM | POA: Insufficient documentation

## 2023-11-16 DIAGNOSIS — F3181 Bipolar II disorder: Secondary | ICD-10-CM | POA: Insufficient documentation

## 2023-11-16 DIAGNOSIS — Z814 Family history of other substance abuse and dependence: Secondary | ICD-10-CM

## 2023-11-16 MED ORDER — ZOLPIDEM 10 MG TABLET
10.0000 mg | ORAL_TABLET | Freq: Every evening | ORAL | 0 refills | Status: DC | PRN
Start: 2023-11-16 — End: 2023-12-18

## 2023-11-16 MED ORDER — HYDROXYZINE PAMOATE 50 MG CAPSULE: 50.0000 mg | ORAL_CAPSULE | Freq: Three times a day (TID) | ORAL | 0 refills | Status: DC | PRN

## 2023-11-16 MED ORDER — DULOXETINE 30 MG CAPSULE,DELAYED RELEASE
90.0000 mg | DELAYED_RELEASE_CAPSULE | Freq: Every day | ORAL | 0 refills | Status: DC
Start: 2023-11-16 — End: 2023-12-18

## 2023-11-16 MED ORDER — LAMOTRIGINE 200 MG TABLET
200.0000 mg | ORAL_TABLET | Freq: Two times a day (BID) | ORAL | 0 refills | Status: DC
Start: 2023-11-16 — End: 2023-12-18

## 2023-11-16 MED ORDER — LORAZEPAM 1 MG TABLET
1.0000 mg | ORAL_TABLET | Freq: Every day | ORAL | 0 refills | Status: DC | PRN
Start: 2023-11-16 — End: 2023-12-18

## 2023-11-16 MED ORDER — BUSPIRONE 30 MG TABLET
30.0000 mg | ORAL_TABLET | Freq: Two times a day (BID) | ORAL | 0 refills | Status: DC
Start: 2023-11-16 — End: 2023-12-18

## 2023-11-16 MED ORDER — ZIPRASIDONE 60 MG CAPSULE
60.0000 mg | ORAL_CAPSULE | Freq: Two times a day (BID) | ORAL | 0 refills | Status: DC
Start: 2023-11-16 — End: 2023-12-18

## 2023-11-16 NOTE — Progress Notes (Signed)
 BE[facility]AV[facility]IC[facility] [place]  [address]  [place] [address]  Operated by [facility]    Name: Cody Mccoy MRN:  [id number]   Date: 11/16/2023 DOB:  1980/10/07 (43 y.o.)       Chief Complaint: Bipolar Disorder, PTSD, and Generalized Anxiety (Adult situational stress d/o)    Subjective:   Patient reports that he had his social security psych eval yesterday.Has had a lot going on right now, uncertainty of his disability. 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   Mood Little off  Medications meds are working well. No ill effects.  Appetite Good  Sleep A lot better. Has been taking Melatonin before bed.   Energy So so.  Stressors Wisdom teeth removal next week.    Problem List[1]  Past Medical History[2]  Past Surgical History[3]  Family History[4]  Social History     Socioeconomic History    Marital status: Single   Tobacco Use    Smoking status: Never    Smokeless tobacco: Never   Vaping Use    Vaping status: Never Used   Substance and Sexual Activity    Alcohol use: Not Currently    Drug use: Never     Social Determinants of Health     Social Connections: Low Risk  (01/23/2023)    Social Connections     SDOH Social Isolation: 5 or more times a week      Latuda [lurasidone] and Prednisone   Current Outpatient Medications   Medication Sig    busPIRone  (BUSPAR ) 30 mg Oral Tablet Take 1 Tablet (30 mg total) by mouth Twice daily for 30 days Indications: repeated episodes of anxiety    Cholecalciferol, Vitamin D3, (VITAMIN D) 25 mcg (1,000 unit) Oral Capsule Take 1 Capsule (1,000 Units total) by mouth Once a day    DULoxetine  (CYMBALTA  DR) 30 mg Oral Capsule, Delayed Release(E.C.) Take 3 Capsules (90 mg total) by mouth Daily for 30 days Indications: anxiousness associated with depression    lamoTRIgine  (LAMICTAL ) 200 mg Oral Tablet Take 1 Tablet (200 mg total) by mouth Twice daily for 30 days Indications: bipolar depression     LORazepam  (ATIVAN ) 1 mg Oral Tablet Take 1 Tablet (1 mg total) by mouth Once per day as needed for Anxiety    metoprolol  tartrate (LOPRESSOR ) 50 mg Oral Tablet Take 1 Tablet (50 mg total) by mouth Twice daily    pregabalin (LYRICA) 150 mg Oral Capsule Take 1 Capsule (150 mg total) by mouth Twice daily    ziprasidone  (GEODON ) 60 mg Oral Capsule Take 1 Capsule (60 mg total) by mouth Twice daily for 30 days Indications: bipolar depression, insomnia    zolpidem  (AMBIEN ) 10 mg Oral Tablet Take 1 Tablet (10 mg total) by mouth Every night as needed for Insomnia for up to 30 days        Objective :  BP 127/73 (Site: Left Arm, Patient Position: Sitting)   Pulse 82   Resp 18   Ht 1.803 m (5' 11)   Wt (!) 139 kg (306 lb)   BMI 42.68 kg/m       PHQ Total Score  PHQ 2 Total: 3  PHQ 9 Total: 7  Interpretation of Total Score: 5-9 Mild depression             09/18/2023    10:52 AM 10/18/2023     9:14 AM 11/16/2023     9:51 AM  Most Recent PHQ-9 Scores   PHQ 9 Total 10 11 7           Mental Status Exam  AXOX4. Casual dress, calm, well-groomed. No SI, HI, AVH, delusions, or paranoia. Thoughts are logical, coherent, and goal-directed. Good eye contact. Speech is normal in rate and tone. Mood is okay affect congruent. No psychomotor agitation or retardation, cogwheel rigidity, or abnormal movements. Gait is normal. Attention, concentration, and memory are good. No cognitive deficits noted. Judgment and insight are fair. Calculation and abstraction are within normal limits.     Data Reviewed  I have reviewed patient's previous note medical, surgical, family, and social history in detail today,     Assessment  Bipolar II Disorder, Generalized Anxiety, and PTSD     Plan  Lamotrigine  200 mg take 1 tab by mouth twice daily for Bipolar II Disorder  Buspirone  30 mg take 1 tab by mouth twice daily for anxiety  Duloxetine  DR 30 mg take 90 mg by mouth daily for anxiety, depression, and PTSD  Increase Lorazepam  1 mg take 1 tab by mouth from  daily to twice daily as needed for anxiety (PMP- 10/18/23, 30 tab 30 days, no refills.)  Ziprasidone  from 60 mg take 1 cap by mouth at bedtime to twice daily for Bipolar II Disorder  Hydroxyzine  Pamoate 50 mg take 1 cap by mouth three times daily as needed for anxiety   Zolpidem  10 mg by mouth at bedtime for insomnia (PMP- filled 10/31/23, 15 tab, 30 day, 1 refill)      Follow up  Follow up in 4 weeks    Jon Apa, APRN,PMHNP-BC        [1]   Patient Active Problem List  Diagnosis    Suicidal ideation    Bipolar II disorder, most recent episode major depressive (CMS HCC)    PTSD (post-traumatic stress disorder)    GAD (generalized anxiety disorder)    Homicidal ideations    Adult situational stress disorder    Insomnia   [2]   Past Medical History:  Diagnosis Date    Anxiety     Depression     HTN (hypertension)     Other manic episodes    [3] No past surgical history on file.  [4]   Family History  Problem Relation Name Age of Onset    No Known Problems Mother      Heart Disease Father      Alcohol abuse Father      Alcohol abuse Paternal Aunt

## 2023-11-16 NOTE — Patient Instructions (Signed)
 Lamotrigine  200 mg take 1 tab by mouth twice daily for Bipolar II Disorder  Buspirone  30 mg take 1 tab by mouth twice daily for anxiety  Duloxetine  DR 30 mg take 90 mg by mouth daily for anxiety, depression, and PTSD  Increase Lorazepam  1 mg take 1 tab by mouth from daily to twice daily as needed for anxiety   Ziprasidone  from 60 mg take 1 cap by mouth at bedtime to twice daily for Bipolar II Disorder  Hydroxyzine  Pamoate 50 mg take 1 cap by mouth three times daily as needed for anxiety   Zolpidem  10 mg by mouth at bedtime for insomnia

## 2023-11-22 ENCOUNTER — Other Ambulatory Visit: Payer: Self-pay

## 2023-11-22 ENCOUNTER — Ambulatory Visit: Payer: Self-pay | Attending: Clinical | Admitting: Clinical

## 2023-11-22 DIAGNOSIS — F3181 Bipolar II disorder: Secondary | ICD-10-CM

## 2023-11-22 DIAGNOSIS — F4310 Post-traumatic stress disorder, unspecified: Secondary | ICD-10-CM

## 2023-11-22 DIAGNOSIS — F411 Generalized anxiety disorder: Secondary | ICD-10-CM

## 2023-11-22 NOTE — Progress Notes (Signed)
 [place] MEDICINE [facility]    [facility] [place] [facility] [place]     Name: Cody Mccoy MRN:  [id number]   Date: 11/22/2023 DOB: 11/20/1980     Start Time: 11:03  End Time: 11:59        Diagnosis:   (F31.81) Bipolar II disorder, most recent episode major depressive (CMS HCC)  (primary encounter diagnosis)    (F41.1) GAD (generalized anxiety disorder)    (F43.10) PTSD (post-traumatic stress disorder)       Mental Status Examination:  OBJECTIVE:     Mood: within normal limits   Affect: congruent to mood   Thought process: goal oriented      Risk Assessment:     Based upon patient presentation and therapist observation during session, patient appears not to be SI/HI.  In the event of increase in SI or HI, I recommended the patient use the following resources to reduce risk of harm to self or others.  Call 9-1-1  Go to the nearest emergency room  Call the National Suicide Prevention Lifeline (988)  Suicide Prevention Lifeline Online Chat - [email or web address].pt  Crisis Call: 416 200 4760 or (304) 325-HOPE    [x]  Client denies all areas of risk. No contrary clinical indications present.   Area of Risk:     Level of Risk:      Intent to Act:    Plan to Act:    Means to Act:  [x]  Low      []  Yes     []  Yes     []  Yes  []  Medium     []  No     []  No     []  No  []  High     [x]  Not Applicable   [x]  Not Applicable   [x]  Not Applicable  []  Imminent    Risk Factors:   Protective Factors:     Current Outpatient Medications   Medication Sig    busPIRone  (BUSPAR ) 30 mg Oral Tablet Take 1 Tablet (30 mg total) by mouth Twice daily for 30 days Indications: repeated episodes of anxiety    Cholecalciferol, Vitamin D3, (VITAMIN D) 25 mcg (1,000 unit) Oral Capsule Take 1 Capsule (1,000 Units total) by mouth Once a day    DULoxetine  (CYMBALTA  DR) 30 mg Oral Capsule, Delayed Release(E.C.) Take 3 Capsules (90 mg total) by mouth Daily for 30 days Indications: anxiousness associated with  depression    hydrOXYzine  pamoate (VISTARIL ) 50 mg Oral Capsule Take 1 Capsule (50 mg total) by mouth Three times a day as needed for Anxiety for up to 30 days    lamoTRIgine  (LAMICTAL ) 200 mg Oral Tablet Take 1 Tablet (200 mg total) by mouth Twice daily for 30 days Indications: bipolar depression    LORazepam  (ATIVAN ) 1 mg Oral Tablet Take 1 Tablet (1 mg total) by mouth Once per day as needed for Anxiety    metoprolol  tartrate (LOPRESSOR ) 50 mg Oral Tablet Take 1 Tablet (50 mg total) by mouth Twice daily    pregabalin  (LYRICA ) 150 mg Oral Capsule Take 1 Capsule (150 mg total) by mouth Twice daily    ziprasidone  (GEODON ) 60 mg Oral Capsule Take 1 Capsule (60 mg total) by mouth Twice daily for 30 days Indications: bipolar depression, insomnia    zolpidem  (AMBIEN ) 10 mg Oral Tablet Take 1 Tablet (10 mg total) by mouth Every night as needed for Insomnia for up to 30 days  SUBJECTIVE:     Cody Mccoy presents today for an individual therapy session to establish care with this provider.  Patient reports that he had his psych evaluation for his disability last week and feels that this went well.  He reports being stressed out due to waiting to find out what his determined on his disability.  Patient spent some time discussing his back injuries since November of 2023 at [facility] and how he re-injured himself in April 2024 due to lifting a heavy item.  Patient spent time discussing some of his strategies that he uses in order to help his mental health.  Patient reports that he enjoys cooking photography and being creative he spent time discussing a variety of topics.  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  He states that he says daily goals for himself and today is to do laundry.  Patient spent time discussing his relationship with his dad and how he feels his dad might possibly have him diagnosed bipolar  and dementia.  He states that his father is an alcoholic and they have always had a strained relationship but they are neighbors and spent a lot of time together patient states he has a 29-year-old dog that he considers a companion.  Patient states that he often rests stretches and uses medication to help with his pain but feels that he is doing the best he can at this point to manage his symptoms.  Patient states that he easily gets angry and ends up isolating himself with racing thoughts he states that he tries to keep himself busy in order to prevent this from happening.          INTERVENTION/PLAN:     Counselor worked with [employer] on pharmacologist. Therapist validated Clifford's feelings while providing positive feedback. Counselor will continue to work with Mabel on developing appropriate coping skills and processing stressors. Supportive psychotherapy was utilized to encourage identification and expression of present feeling states, validate expressed feelings, and encourage self-efficacy. Therapeutic skills used: developing and using coping skills and developing and using relaxation techniques Billye will continue to be encouraged to maintain regular attendance to scheduled appointments, identify triggers for emotional and/or behavioral relapse, and develop effective coping strategies. Madix will return to clinic in 3 weeks or sooner if needed.         [address], LICSW, 11/22/2023, 12:48

## 2023-12-07 ENCOUNTER — Encounter (HOSPITAL_PSYCHIATRIC): Payer: Self-pay

## 2023-12-07 NOTE — Telephone Encounter (Signed)
 Patient called and said the provider who wrote his Lyrica is no longer practicing. His PCP doesn't want to take it over and he wanted to know if you felt comfortable doing so. Looks like its 150mg  BID. Next appointment with you in 12/18/23. Happy to call patient back with any advice/orders you may have or to schedule a sooner appointment. Thanks!

## 2023-12-11 ENCOUNTER — Telehealth (HOSPITAL_PSYCHIATRIC): Payer: Self-pay

## 2023-12-11 NOTE — Telephone Encounter (Signed)
 I know there is a note already for the Lyrica for this patient, he called and stated that he was being prescribed for neuropathy but thinks it also helps his mood as well.

## 2023-12-13 ENCOUNTER — Ambulatory Visit (HOSPITAL_PSYCHIATRIC): Payer: Self-pay | Admitting: Clinical

## 2023-12-18 ENCOUNTER — Other Ambulatory Visit: Payer: Self-pay

## 2023-12-18 ENCOUNTER — Encounter (HOSPITAL_PSYCHIATRIC): Payer: Self-pay

## 2023-12-18 ENCOUNTER — Ambulatory Visit: Payer: Self-pay

## 2023-12-18 VITALS — BP 138/92 | HR 74 | Resp 17 | Ht 71.0 in | Wt 310.0 lb

## 2023-12-18 DIAGNOSIS — G4700 Insomnia, unspecified: Secondary | ICD-10-CM

## 2023-12-18 DIAGNOSIS — Z79899 Other long term (current) drug therapy: Secondary | ICD-10-CM

## 2023-12-18 DIAGNOSIS — F3181 Bipolar II disorder: Secondary | ICD-10-CM

## 2023-12-18 DIAGNOSIS — F411 Generalized anxiety disorder: Secondary | ICD-10-CM

## 2023-12-18 DIAGNOSIS — F4310 Post-traumatic stress disorder, unspecified: Secondary | ICD-10-CM

## 2023-12-18 MED ORDER — BUSPIRONE 30 MG TABLET
30.0000 mg | ORAL_TABLET | Freq: Two times a day (BID) | ORAL | 0 refills | Status: DC
Start: 2023-12-18 — End: 2024-01-15

## 2023-12-18 MED ORDER — PREGABALIN 150 MG CAPSULE
150.0000 mg | ORAL_CAPSULE | Freq: Two times a day (BID) | ORAL | 1 refills | Status: DC
Start: 2023-12-18 — End: 2024-02-12

## 2023-12-18 MED ORDER — LORAZEPAM 1 MG TABLET
1.0000 mg | ORAL_TABLET | Freq: Every day | ORAL | 0 refills | Status: DC | PRN
Start: 2023-12-18 — End: 2024-01-15

## 2023-12-18 MED ORDER — LAMOTRIGINE 200 MG TABLET
200.0000 mg | ORAL_TABLET | Freq: Two times a day (BID) | ORAL | 0 refills | Status: DC
Start: 2023-12-18 — End: 2024-01-15

## 2023-12-18 MED ORDER — ZIPRASIDONE 80 MG CAPSULE
80.0000 mg | ORAL_CAPSULE | Freq: Two times a day (BID) | ORAL | 1 refills | Status: DC
Start: 2023-12-18 — End: 2024-01-15

## 2023-12-18 MED ORDER — DULOXETINE 30 MG CAPSULE,DELAYED RELEASE
90.0000 mg | DELAYED_RELEASE_CAPSULE | Freq: Every day | ORAL | 0 refills | Status: DC
Start: 2023-12-18 — End: 2024-01-15

## 2023-12-18 MED ORDER — ZOLPIDEM 10 MG TABLET
10.0000 mg | ORAL_TABLET | Freq: Every evening | ORAL | 0 refills | Status: DC | PRN
Start: 2023-12-18 — End: 2024-01-15

## 2023-12-18 NOTE — Patient Instructions (Signed)
 Lamotrigine  200 mg take 1 tab by mouth twice daily for Bipolar II Disorder  Buspirone  30 mg take 1 tab by mouth twice daily for anxiety  Duloxetine  DR 30 mg take 90 mg by mouth daily for anxiety, depression, and PTSD  Lorazepam  1 mg take 1 tab by mouth from daily to twice daily as needed for anxiety Increase Ziprasidone  from 60 mg to 80 mg take 1 cap by mouth at bedtime to twice daily for Bipolar II Disorder  Hydroxyzine  Pamoate 50 mg take 1 cap by mouth three times daily as needed for anxiety   Zolpidem  10 mg by mouth at bedtime for insomnia   Pregabalin  150 mg take 1 cap by mouth twice daily for mood disorder

## 2023-12-18 NOTE — Progress Notes (Signed)
 BEHAVIORAL MEDICINE, [facility]  [address]  [place] [address]  Operated by [facility]    Name: Cody Mccoy MRN:  [id number]   Date: 12/18/2023 DOB:  1980-06-27 (43 y.o.)       Chief Complaint: Bipolar Disorder    Subjective:   Patient reports that the Dr that was prescribing the Lyrica  went zip lining and lost his credentials because he fell. Reports that he has had a rough couple of weeks. Reports that his anxiety has been off the charts.I'm not sure if I'm going manic or if it's just my anxiety. Reports that he had a recent sinus infection.    Mood A little bit down today.  Medications working fair. No ill effects.  Appetite Average. Doing low carb diet.   Sleep Really good  Energy a little down  Stressors Issues getting his Lyrica . Has na eye appointment this afternoon.     Problem List[1]  Past Medical History[2]  Past Surgical History[3]  Family History[4]  Social History     Socioeconomic History    Marital status: Single   Tobacco Use    Smoking status: Never    Smokeless tobacco: Never   Vaping Use    Vaping status: Never Used   Substance and Sexual Activity    Alcohol use: Not Currently    Drug use: Never     Social Determinants of Health     Social Connections: Low Risk (01/23/2023)    Social Connections     SDOH Social Isolation: 5 or more times a week      Latuda [lurasidone] and Prednisone   Current Outpatient Medications   Medication Sig    atorvastatin (LIPITOR) 10 mg Oral Tablet Take 1 Tablet (10 mg total) by mouth Daily    busPIRone  (BUSPAR ) 30 mg Oral Tablet Take 1 Tablet (30 mg total) by mouth Twice daily for 30 days Indications: repeated episodes of anxiety    Cholecalciferol, Vitamin D3, (VITAMIN D) 25 mcg (1,000 unit) Oral Capsule Take 1 Capsule (1,000 Units total) by mouth Once a day    DULoxetine  (CYMBALTA  DR) 30 mg Oral Capsule, Delayed Release(E.C.) Take 3 Capsules (90 mg total) by mouth Daily for 30 days Indications:  anxiousness associated with depression    hydrOXYzine  pamoate (VISTARIL ) 50 mg Oral Capsule Take 1 Capsule (50 mg total) by mouth Three times a day as needed for Anxiety for up to 30 days    lamoTRIgine  (LAMICTAL ) 200 mg Oral Tablet Take 1 Tablet (200 mg total) by mouth Twice daily for 30 days Indications: bipolar depression    LORazepam  (ATIVAN ) 1 mg Oral Tablet Take 1 Tablet (1 mg total) by mouth Once per day as needed for Anxiety    metoprolol  tartrate (LOPRESSOR ) 50 mg Oral Tablet Take 1 Tablet (50 mg total) by mouth Twice daily    niacin 100 mg Oral Capsule Take 1 Capsule (100 mg total) by mouth Daily    omega-3-DHA-EPA-fish oil (FISH OIL) 1,000 (120-180) mg Oral Capsule Take 1 Capsule by mouth Daily    pregabalin  (LYRICA ) 150 mg Oral Capsule Take 1 Capsule (150 mg total) by mouth Twice daily    ziprasidone  (GEODON ) 60 mg Oral Capsule Take 1 Capsule (60 mg total) by mouth Twice daily for 30 days Indications: bipolar depression, insomnia    zolpidem  (AMBIEN ) 10 mg Oral Tablet Take 1 Tablet (10 mg total) by mouth Every night as needed for Insomnia for up to 30 days  Objective :  BP (!) 138/92   Pulse 74   Resp 17   Ht 1.803 m (5' 11)   Wt (!) 141 kg (310 lb)   BMI 43.24 kg/m       PHQ Total Score  PHQ 2 Total: 5  PHQ 9 Total: 13  Interpretation of Total Score: 10-14 Moderate depression             10/18/2023     9:14 AM 11/16/2023     9:51 AM 12/18/2023    10:19 AM   Most Recent PHQ-9 Scores   PHQ 9 Total 11 7 13            Mental Status Exam  AXOX4. Casual dress, calm, well-groomed. No SI, HI, AVH, delusions, or paranoia. Thoughts are logical, coherent, and goal-directed. Good eye contact. Speech is normal in rate and tone. Mood is okay affect congruent. No psychomotor agitation or retardation, cogwheel rigidity, or abnormal movements. Gait is normal. Attention, concentration, and memory are good. No cognitive deficits noted. Judgment and insight are fair. Calculation and abstraction are within normal  limits.     Data Reviewed  I have reviewed patient's previous note medical, surgical, family, and social history in detail today,     Assessment  Bipolar II Disorder, Generalized Anxiety, and PTSD     Plan  Lamotrigine  200 mg take 1 tab by mouth twice daily for Bipolar II Disorder  Buspirone  30 mg take 1 tab by mouth twice daily for anxiety  Duloxetine  DR 30 mg take 90 mg by mouth daily for anxiety, depression, and PTSD  Lorazepam  1 mg take 1 tab by mouth from daily to twice daily as needed for anxiety Increase Ziprasidone  from 60 mg to 80 mg take 1 cap by mouth at bedtime to twice daily for Bipolar II Disorder  Hydroxyzine  Pamoate 50 mg take 1 cap by mouth three times daily as needed for anxiety   Zolpidem  10 mg by mouth at bedtime for insomnia   Pregabalin  150 mg take 1 cap by mouth twice daily for mood disorder      Follow up  Follow up in 4 weeks     Jon Apa, APRN,PMHNP-BC        [1]   Patient Active Problem List  Diagnosis    Suicidal ideation    Bipolar II disorder, most recent episode major depressive (CMS HCC)    PTSD (post-traumatic stress disorder)    GAD (generalized anxiety disorder)    Homicidal ideations    Adult situational stress disorder    Insomnia   [2]   Past Medical History:  Diagnosis Date    Anxiety     Depression     HTN (hypertension)     Other manic episodes    [3] No past surgical history on file.  [4]   Family History  Problem Relation Name Age of Onset    No Known Problems Mother      Heart Disease Father      Alcohol abuse Father      Alcohol abuse Paternal Aunt

## 2023-12-20 ENCOUNTER — Ambulatory Visit: Payer: Self-pay | Attending: Clinical | Admitting: Clinical

## 2023-12-20 ENCOUNTER — Other Ambulatory Visit: Payer: Self-pay

## 2023-12-20 DIAGNOSIS — F3181 Bipolar II disorder: Secondary | ICD-10-CM | POA: Insufficient documentation

## 2023-12-20 DIAGNOSIS — F4320 Adjustment disorder, unspecified: Secondary | ICD-10-CM | POA: Insufficient documentation

## 2023-12-20 DIAGNOSIS — F411 Generalized anxiety disorder: Secondary | ICD-10-CM | POA: Insufficient documentation

## 2023-12-20 DIAGNOSIS — F4310 Post-traumatic stress disorder, unspecified: Secondary | ICD-10-CM | POA: Insufficient documentation

## 2023-12-20 NOTE — Progress Notes (Signed)
 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  In the event of increase in SI or HI, I recommended the patient use the following resources to reduce risk of harm to self or others.  Call 9-1-1  Go to the nearest emergency room  Call the National Suicide Prevention Lifeline (988)  Suicide Prevention Lifeline Online Chat - [email or web address].pt  Crisis Call: (909) 179-8466 or (304) 325-HOPE    [x]  Client denies all areas of risk. No contrary clinical indications present.   Area of Risk:     Level of Risk:      Intent to Act:    Plan to Act:    Means to Act:  [x]  Low      []  Yes     []  Yes     []  Yes  []  Medium     []  No     []  No     []  No  []  High     [x]  Not Applicable   [x]  Not Applicable   [x]  Not Applicable  []  Imminent    Risk Factors:   Protective Factors:     Current Outpatient Medications   Medication Sig    atorvastatin (LIPITOR) 10 mg Oral Tablet Take 1 Tablet (10 mg total) by mouth Daily    busPIRone  (BUSPAR ) 30 mg Oral Tablet Take 1 Tablet (30 mg total) by mouth Twice daily Indications: repeated episodes of anxiety    Cholecalciferol, Vitamin D3, (VITAMIN D) 25 mcg (1,000 unit) Oral Capsule Take 1 Capsule (1,000 Units total) by mouth Once a day    DULoxetine  (CYMBALTA  DR) 30 mg Oral Capsule, Delayed Release(E.C.) Take  3 Capsules (90 mg total) by mouth Daily Indications: anxiousness associated with depression    lamoTRIgine  (LAMICTAL ) 200 mg Oral Tablet Take 1 Tablet (200 mg total) by mouth Twice daily Indications: bipolar depression    LORazepam  (ATIVAN ) 1 mg Oral Tablet Take 1 Tablet (1 mg total) by mouth Once per day as needed for Anxiety    metoprolol  tartrate (LOPRESSOR ) 50 mg Oral Tablet Take 1 Tablet (50 mg total) by mouth Twice daily    niacin 100 mg Oral Capsule Take 1 Capsule (100 mg total) by mouth Daily    omega-3-DHA-EPA-fish oil (FISH OIL) 1,000 (120-180) mg Oral Capsule Take 1 Capsule by mouth Daily    pregabalin  (LYRICA ) 150 mg Oral Capsule Take 1 Capsule (150 mg total) by mouth Twice daily    ziprasidone  (GEODON ) 80 mg Oral Capsule Take 1 Capsule (80 mg total) by mouth Twice daily for 60 days Indications: bipolar depression, insomnia    zolpidem  (AMBIEN ) 10 mg Oral Tablet Take 1 Tablet (10 mg total) by mouth Every night as needed for Insomnia  for up to 30 days              SUBJECTIVE:     Cody Mccoy presents today for an individual therapy session to continue addressing ongoing struggles related to bipolar and anxiety.  Patient reports that he is feeling more anxious but feels like this is due to some medication changes he has been experiencing.  Patient spent time discussing his holiday plans to make people there presence.  He spent time discussing some festivals and things that he is interested in going to as well.  Overall patient feels like he is doing okay just dealing with anxiety and panic but he reports feeling that he has some tools in place to help him cope better.          INTERVENTION/PLAN:     Therapist validated Cody Mccoy's feelings while providing positive feedback. Counselor will continue to work with Cody Mccoy on developing appropriate coping skills and processing stressors. Supportive psychotherapy was utilized to encourage identification and expression of present feeling states, validate expressed  feelings, and encourage self-efficacy. Therapeutic skills used: developing and using coping skills and developing and using containment stratediges Cody Mccoy will continue to be encouraged to maintain regular attendance to scheduled appointments, identify triggers for emotional and/or behavioral relapse, and develop effective coping strategies. Cody Mccoy will return to clinic in 2 weeks or sooner if needed.         Heather Harding, LICSW, 12/20/2023, 11:32

## 2024-01-10 ENCOUNTER — Ambulatory Visit: Payer: Self-pay | Attending: Clinical | Admitting: Clinical

## 2024-01-10 ENCOUNTER — Other Ambulatory Visit: Payer: Self-pay

## 2024-01-10 DIAGNOSIS — F3181 Bipolar II disorder: Secondary | ICD-10-CM | POA: Insufficient documentation

## 2024-01-10 DIAGNOSIS — F840 Autistic disorder: Secondary | ICD-10-CM | POA: Insufficient documentation

## 2024-01-10 NOTE — Progress Notes (Signed)
 [place] MEDICINE [facility]    [facility] [place]     Name: Cody Mccoy MRN:  [id number]   Date: 01/10/2024 DOB: 08/11/1980     Start Time: 8:00  End Time: 8:45      Diagnosis:   (F84.0) Autism spectrum disorder  (primary encounter diagnosis)    (F31.81) Bipolar II disorder, most recent episode major depressive (CMS HCC)       Mental Status Examination:  OBJECTIVE:     Mood: within normal limits   Affect: congruent to mood   Thought process: linear      Risk Assessment:     Based upon patient presentation and therapist observation during session, patient appears not to be SI/HI.  In the event of increase in SI or HI, I recommended the patient use the following resources to reduce risk of harm to self or others.  Call 9-1-1  Go to the nearest emergency room  Call the National Suicide Prevention Lifeline (988)  Suicide Prevention Lifeline Online Chat - [email or web address].pt  Crisis Call: 785-369-3763 or (304) 325-HOPE    [x]  Client denies all areas of risk. No contrary clinical indications present.   Area of Risk:     Level of Risk:      Intent to Act:    Plan to Act:    Means to Act:  [x]  Low      []  Yes     []  Yes     []  Yes  []  Medium     []  No     []  No     []  No  []  High     [x]  Not Applicable   [x]  Not Applicable   [x]  Not Applicable  []  Imminent    Risk Factors:   Protective Factors:     Current Outpatient Medications   Medication Sig    atorvastatin (LIPITOR) 10 mg Oral Tablet Take 1 Tablet (10 mg total) by mouth Daily    busPIRone  (BUSPAR ) 30 mg Oral Tablet Take 1 Tablet (30 mg total) by mouth Twice daily Indications: repeated episodes of anxiety    Cholecalciferol, Vitamin D3, (VITAMIN D) 25 mcg (1,000 unit) Oral Capsule Take 1 Capsule (1,000 Units total) by mouth Once a day    DULoxetine  (CYMBALTA  DR) 30 mg Oral Capsule, Delayed Release(E.C.) Take 3 Capsules (90 mg total) by mouth Daily Indications: anxiousness associated with depression     lamoTRIgine  (LAMICTAL ) 200 mg Oral Tablet Take 1 Tablet (200 mg total) by mouth Twice daily Indications: bipolar depression    LORazepam  (ATIVAN ) 1 mg Oral Tablet Take 1 Tablet (1 mg total) by mouth Once per day as needed for Anxiety    metoprolol  tartrate (LOPRESSOR ) 50 mg Oral Tablet Take 1 Tablet (50 mg total) by mouth Twice daily    niacin 100 mg Oral Capsule Take 1 Capsule (100 mg total) by mouth Daily    omega-3-DHA-EPA-fish oil (FISH OIL) 1,000 (120-180) mg Oral Capsule Take 1 Capsule by mouth Daily    pregabalin  (LYRICA ) 150 mg Oral Capsule Take 1 Capsule (150 mg total) by mouth Twice daily    ziprasidone  (GEODON ) 80 mg Oral Capsule Take 1 Capsule (80 mg total) by mouth Twice daily for 60 days Indications: bipolar depression, insomnia    zolpidem  (AMBIEN ) 10 mg Oral Tablet Take 1 Tablet (10 mg total) by mouth Every night as needed for Insomnia for up to 30 days  SUBJECTIVE:     Cody Mccoy presents today for an individual therapy session to continue addressing ongoing struggles related to Bipolar symptoms.  Time was spent discussing patient's high school experience as a teenager and when he 1st noticed that he started having bipolar symptoms.  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  He feels like this is the best option at this time.  He spent time discussing various subjects.      INTERVENTION/PLAN:  Therapist validated Cody Mccoy's feelings while providing positive feedback. Counselor will continue to work with Cody Mccoy on developing appropriate coping skills and processing stressors. Supportive psychotherapyand CBT skills were utilized to encourage identification and expression of present feeling states, validate expressed feelings, and encourage self-efficacy. Therapeutic skills used: developing and using coping skills and developing and using containment stratediges Cody Mccoy will continue to be encouraged to maintain regular attendance to  scheduled appointments, identify triggers for emotional and/or behavioral relapse, and develop effective coping strategies. Cody Mccoy will return to clinic in 3 weeks and 1 month or sooner if needed.         Heather Harding, LICSW, 01/10/2024, 12:25

## 2024-01-14 ENCOUNTER — Other Ambulatory Visit (HOSPITAL_PSYCHIATRIC): Payer: Self-pay

## 2024-01-15 ENCOUNTER — Ambulatory Visit: Payer: Self-pay

## 2024-01-15 ENCOUNTER — Encounter (HOSPITAL_PSYCHIATRIC): Payer: Self-pay

## 2024-01-15 ENCOUNTER — Other Ambulatory Visit: Payer: Self-pay

## 2024-01-15 VITALS — BP 146/95 | HR 71 | Resp 18 | Ht 71.0 in | Wt 315.0 lb

## 2024-01-15 DIAGNOSIS — F3181 Bipolar II disorder: Secondary | ICD-10-CM

## 2024-01-15 DIAGNOSIS — F840 Autistic disorder: Secondary | ICD-10-CM

## 2024-01-15 DIAGNOSIS — F411 Generalized anxiety disorder: Secondary | ICD-10-CM

## 2024-01-15 DIAGNOSIS — G4700 Insomnia, unspecified: Secondary | ICD-10-CM

## 2024-01-15 DIAGNOSIS — F4320 Adjustment disorder, unspecified: Secondary | ICD-10-CM

## 2024-01-15 DIAGNOSIS — Z79899 Other long term (current) drug therapy: Secondary | ICD-10-CM

## 2024-01-15 DIAGNOSIS — F4310 Post-traumatic stress disorder, unspecified: Secondary | ICD-10-CM

## 2024-01-15 MED ORDER — ZOLPIDEM 10 MG TABLET
10.0000 mg | ORAL_TABLET | Freq: Every evening | ORAL | 0 refills | Status: DC | PRN
Start: 2024-01-15 — End: 2024-02-12

## 2024-01-15 MED ORDER — LORAZEPAM 1 MG TABLET
1.0000 mg | ORAL_TABLET | Freq: Every day | ORAL | 0 refills | Status: DC | PRN
Start: 2024-01-15 — End: 2024-02-12

## 2024-01-15 MED ORDER — BUSPIRONE 30 MG TABLET
30.0000 mg | ORAL_TABLET | Freq: Two times a day (BID) | ORAL | 0 refills | Status: DC
Start: 2024-01-15 — End: 2024-02-12

## 2024-01-15 MED ORDER — LAMOTRIGINE 200 MG TABLET
200.0000 mg | ORAL_TABLET | Freq: Two times a day (BID) | ORAL | 0 refills | Status: DC
Start: 2024-01-15 — End: 2024-02-12

## 2024-01-15 MED ORDER — DULOXETINE 60 MG CAPSULE,DELAYED RELEASE
120.0000 mg | DELAYED_RELEASE_CAPSULE | Freq: Every day | ORAL | 1 refills | Status: DC
Start: 2024-01-15 — End: 2024-02-12

## 2024-01-15 MED ORDER — ZIPRASIDONE 80 MG CAPSULE
80.0000 mg | ORAL_CAPSULE | Freq: Two times a day (BID) | ORAL | 1 refills | Status: DC
Start: 2024-01-15 — End: 2024-02-12

## 2024-01-15 NOTE — Telephone Encounter (Signed)
 Patient can get refills at scheduled appointment today

## 2024-01-15 NOTE — Progress Notes (Signed)
 BEHAVIORAL MEDICINE, [facility]  [address]  [place] [address]  Operated by [facility]    Name: Cody Mccoy MRN:  [id number]   Date: 01/15/2024 DOB:  Jun 23, 1980 (43 y.o.)       Chief Complaint: Bipolar Disorder and Generalized Anxiety    Subjective:   Patient reports that he is having his deck torn down. Has had a lot of bad going on this past month. 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 Has butted heads with his pastor.Got turned down for disability. Feels really down.   Does therapy every 3 weeks. Has been in therapy for approx 1 year.    Mood ok  Medications No ill effects  Appetite Over eating. Hungry all the time.   Sleep Has had some strange dreams.  Energy low  [employer]. Had to sell some stocks    Problem List[1]  Past Medical History[2]  Past Surgical History[3]  Family History[4]  Social History     Socioeconomic History    Marital status: Single   Tobacco Use    Smoking status: Never    Smokeless tobacco: Never   Vaping Use    Vaping status: Never Used   Substance and Sexual Activity    Alcohol use: Not Currently    Drug use: Never     Social Determinants of Health     Social Connections: Low Risk (01/23/2023)    Social Connections     SDOH Social Isolation: 5 or more times a week      Latuda [lurasidone] and Prednisone   Current Outpatient Medications   Medication Sig    atorvastatin (LIPITOR) 10 mg Oral Tablet Take 1 Tablet (10 mg total) by mouth Daily    busPIRone  (BUSPAR ) 30 mg Oral Tablet Take 1 Tablet (30 mg total) by mouth Twice daily Indications: repeated episodes of anxiety    Cholecalciferol, Vitamin D3, (VITAMIN D) 25 mcg (1,000 unit) Oral Capsule Take 1 Capsule (1,000 Units total) by mouth Once a day    DULoxetine  (CYMBALTA  DR) 30 mg Oral Capsule, Delayed Release(E.C.) Take 3 Capsules (90 mg total) by mouth Daily Indications: anxiousness associated with depression    Garlic 1,000 mg Oral Capsule Take 1 Capsule (1,000 mg total) by mouth Daily     hydrOXYzine  pamoate (VISTARIL ) 50 mg Oral Capsule Take 1 Capsule (50 mg total) by mouth Three times a day as needed for Anxiety for up to 30 days    lamoTRIgine  (LAMICTAL ) 200 mg Oral Tablet Take 1 Tablet (200 mg total) by mouth Twice daily Indications: bipolar depression    LORazepam  (ATIVAN ) 1 mg Oral Tablet Take 1 Tablet (1 mg total) by mouth Once per day as needed for Anxiety    metoprolol  tartrate (LOPRESSOR ) 50 mg Oral Tablet Take 1 Tablet (50 mg total) by mouth Twice daily    niacin 100 mg Oral Capsule Take 1 Capsule (100 mg total) by mouth Daily    omega-3-DHA-EPA-fish oil (FISH OIL) 1,000 (120-180) mg Oral Capsule Take 1 Capsule by mouth Daily    pregabalin  (LYRICA ) 150 mg Oral Capsule Take 1 Capsule (150 mg total) by mouth Twice daily    ziprasidone  (GEODON ) 80 mg Oral Capsule Take 1 Capsule (80 mg total) by mouth Twice daily for 60 days Indications: bipolar depression, insomnia    zolpidem  (AMBIEN ) 10 mg Oral Tablet Take 1 Tablet (10 mg total) by mouth Every night as needed for Insomnia for up to 30 days        Objective :  BP (!) 146/95   Pulse 71   Resp 18   Ht 1.803 m (5' 11)   Wt (!) 143 kg (315 lb)   BMI 43.93 kg/m       PHQ Total Score                      10/18/2023     9:14 AM 11/16/2023     9:51 AM 12/18/2023    10:19 AM   Most Recent PHQ-9 Scores   PHQ 9 Total 11 7 13            Mental Status Exam  AXOX4. Casual dress, calm, well-groomed. No SI, HI, AVH, delusions, or paranoia. Thoughts are logical, coherent, and goal-directed. Good eye contact. Speech is normal in rate and tone. Mood is okay affect congruent. No psychomotor agitation or retardation, cogwheel rigidity, or abnormal movements. Gait is normal. Attention, concentration, and memory are good. No cognitive deficits noted. Judgment and insight are fair. Calculation and abstraction are within normal limits.     Data Reviewed  I have reviewed patient's previous note medical, surgical, family, and social history in detail today,      Assessment  Bipolar II Disorder, Generalized Anxiety, and PTSD     Plan  Lamotrigine  200 mg take 1 tab by mouth twice daily for Bipolar II Disorder  Buspirone  30 mg take 1 tab by mouth twice daily for anxiety  Increase Duloxetine  DR 30 mg take from 90 mg to 120 mg by mouth daily for anxiety, depression, and PTSD  Lorazepam  1 mg take 1 tab by mouth twice daily as needed for anxiety   Ziprasidone  80 mg take 1 cap by mouth twice daily for Bipolar II Disorder  Hydroxyzine  Pamoate 50 mg take 1 cap by mouth three times daily as needed for anxiety   Zolpidem  10 mg by mouth at bedtime for insomnia   Pregabalin  150 mg take 1 cap by mouth twice daily for mood disorder      Follow up  Follow up in 4 weeks     Jon Apa, APRN, CNP        [1]   Patient Active Problem List  Diagnosis    Suicidal ideation    Bipolar II disorder, most recent episode major depressive (CMS HCC)    PTSD (post-traumatic stress disorder)    GAD (generalized anxiety disorder)    Homicidal ideations    Adult situational stress disorder    Insomnia    Autism spectrum disorder   [2]   Past Medical History:  Diagnosis Date    Anxiety     Depression     HTN (hypertension)     Other manic episodes    [3] No past surgical history on file.  [4]   Family History  Problem Relation Name Age of Onset    No Known Problems Mother      Heart Disease Father      Alcohol abuse Father      Alcohol abuse Paternal Aunt

## 2024-02-07 ENCOUNTER — Ambulatory Visit: Payer: Self-pay | Attending: Clinical | Admitting: Clinical

## 2024-02-07 ENCOUNTER — Other Ambulatory Visit: Payer: Self-pay

## 2024-02-07 DIAGNOSIS — F3181 Bipolar II disorder: Secondary | ICD-10-CM

## 2024-02-07 DIAGNOSIS — F411 Generalized anxiety disorder: Secondary | ICD-10-CM

## 2024-02-07 DIAGNOSIS — F840 Autistic disorder: Secondary | ICD-10-CM

## 2024-02-07 DIAGNOSIS — F4310 Post-traumatic stress disorder, unspecified: Secondary | ICD-10-CM

## 2024-02-07 NOTE — Progress Notes (Signed)
 [place] MEDICINE [facility]    [facility]     Name: Cody Mccoy MRN:  [id number]   Date: 02/07/2024 DOB: 27-Jul-1980     Start Time: 8:03  End Time: 8:58        Diagnosis:   (F31.81) Bipolar II disorder, most recent episode major depressive (CMS HCC)  (primary encounter diagnosis)    (F84.0) Autism spectrum disorder    (F41.1) GAD (generalized anxiety disorder)    (F43.10) PTSD (post-traumatic stress disorder)       Mental Status Examination:  OBJECTIVE:     Mood: within normal limits   Affect: congruent to mood   Thought process: linear      Risk Assessment:     Based upon patient presentation and therapist observation during session, patient appears not to be SI/HI.  In the event of increase in SI or HI, I recommended the patient use the following resources to reduce risk of harm to self or others.  Call 9-1-1  Go to the nearest emergency room  Call the National Suicide Prevention Lifeline (988)  Suicide Prevention Lifeline Online Chat - dealerspiff.com.pt  Crisis Call: (571)254-2223 or (304) 325-HOPE    [x]  Client denies all areas of risk. No contrary clinical indications present.   Area of Risk:     Level of Risk:      Intent to Act:    Plan to Act:    Means to Act:  [x]  Low      []  Yes     []  Yes     []  Yes  []  Medium     []  No     []  No     []  No  []  High     [x]  Not Applicable   [x]  Not Applicable   [x]  Not Applicable  []  Imminent    Risk Factors:   Protective Factors:     Current Outpatient Medications   Medication Sig    atorvastatin (LIPITOR) 10 mg Oral Tablet Take 1 Tablet (10 mg total) by mouth Daily    busPIRone  (BUSPAR ) 30 mg Oral Tablet Take 1 Tablet (30 mg total) by mouth Twice daily Indications: repeated episodes of anxiety    Cholecalciferol, Vitamin D3, (VITAMIN D) 25 mcg (1,000 unit) Oral Capsule Take 1 Capsule (1,000 Units total) by mouth Once a day    DULoxetine  (CYMBALTA  DR) 60 mg Oral Capsule, Delayed Release(E.C.)  Take 2 Capsules (120 mg total) by mouth Daily Indications: anxiousness associated with depression    Garlic 1,000 mg Oral Capsule Take 1 Capsule (1,000 mg total) by mouth Daily    lamoTRIgine  (LAMICTAL ) 200 mg Oral Tablet Take 1 Tablet (200 mg total) by mouth Twice daily Indications: bipolar depression    LORazepam  (ATIVAN ) 1 mg Oral Tablet Take 1 Tablet (1 mg total) by mouth Once per day as needed for Anxiety    metoprolol  tartrate (LOPRESSOR ) 50 mg Oral Tablet Take 1 Tablet (50 mg total) by mouth Twice daily    niacin 100 mg Oral Capsule Take 1 Capsule (100 mg total) by mouth Daily    omega-3-DHA-EPA-fish oil (FISH OIL) 1,000 (120-180) mg Oral Capsule Take 1 Capsule by mouth Daily    pregabalin  (LYRICA ) 150 mg Oral Capsule Take 1 Capsule (150 mg total) by mouth Twice daily    ziprasidone  (GEODON ) 80 mg Oral Capsule Take 1 Capsule (80 mg total) by mouth Twice daily for 60 days Indications: bipolar depression, insomnia  zolpidem  (AMBIEN ) 10 mg Oral Tablet Take 1 Tablet (10 mg total) by mouth Every night as needed for Insomnia for up to 30 days              SUBJECTIVE:     Latrail presents today for an individual therapy session to continue addressing ongoing struggles related to his dad's health and spent time processing this.  Overall patient feels like he is doing okay however he feels like his medication needs to be tweaked a little bit in order to feel more stable.  He reports that he typically has seasonal depression this time of year.        INTERVENTION/PLAN:  Therapist validated Demontrae's feelings while providing positive feedback. Counselor will continue to work with Mabel on developing appropriate coping skills and processing stressors. Supportive psychotherapyand CBT skills were utilized to encourage identification and expression of present feeling states, validate expressed feelings, and encourage self-efficacy. Therapeutic skills used: developing and using coping skills Raphael will continue to  be encouraged to maintain regular attendance to scheduled appointments, identify triggers for emotional and/or behavioral relapse, and develop effective coping strategies. Rondal will return to clinic in 1 month or sooner if needed.         [address], LICSW, 02/07/2024, 10:15

## 2024-02-12 ENCOUNTER — Other Ambulatory Visit: Payer: Self-pay

## 2024-02-12 ENCOUNTER — Ambulatory Visit: Payer: Self-pay

## 2024-02-12 VITALS — BP 119/68 | HR 65 | Resp 18 | Ht 71.0 in | Wt 301.0 lb

## 2024-02-12 DIAGNOSIS — F4310 Post-traumatic stress disorder, unspecified: Secondary | ICD-10-CM

## 2024-02-12 DIAGNOSIS — Z79899 Other long term (current) drug therapy: Secondary | ICD-10-CM

## 2024-02-12 DIAGNOSIS — F3181 Bipolar II disorder: Secondary | ICD-10-CM

## 2024-02-12 DIAGNOSIS — G4700 Insomnia, unspecified: Secondary | ICD-10-CM | POA: Insufficient documentation

## 2024-02-12 DIAGNOSIS — F411 Generalized anxiety disorder: Secondary | ICD-10-CM

## 2024-02-12 MED ORDER — DULOXETINE 60 MG CAPSULE,DELAYED RELEASE: 120.0000 mg | DELAYED_RELEASE_CAPSULE | Freq: Every day | ORAL | 1 refills | Status: DC

## 2024-02-12 MED ORDER — ZIPRASIDONE 80 MG CAPSULE: 80.0000 mg | ORAL_CAPSULE | Freq: Two times a day (BID) | ORAL | 1 refills | Status: DC

## 2024-02-12 MED ORDER — LAMOTRIGINE 200 MG TABLET: 200.0000 mg | ORAL_TABLET | Freq: Two times a day (BID) | ORAL | 0 refills | Status: DC

## 2024-02-12 MED ORDER — ZOLPIDEM 10 MG TABLET: 10.0000 mg | ORAL_TABLET | Freq: Every evening | ORAL | 0 refills | Status: DC | PRN

## 2024-02-12 MED ORDER — LORAZEPAM 1 MG TABLET: 1.0000 mg | ORAL_TABLET | Freq: Two times a day (BID) | ORAL | 0 refills | Status: DC | PRN

## 2024-02-12 MED ORDER — BUSPIRONE 30 MG TABLET: 30.0000 mg | ORAL_TABLET | Freq: Two times a day (BID) | ORAL | 0 refills | Status: DC

## 2024-02-12 MED ORDER — PREGABALIN 150 MG CAPSULE: 150.0000 mg | ORAL_CAPSULE | Freq: Two times a day (BID) | ORAL | 1 refills | Status: DC

## 2024-02-12 NOTE — Progress Notes (Signed)
 BEHAVIORAL MEDICINE, [facility]  [address]  [place] [address]  Operated by [facility]    Name: Cody Mccoy MRN:  [id number]   Date: 02/12/2024 DOB:  Jun 06, 1980 (43 y.o.)       Chief Complaint: Bipolar Disorder    Subjective:   Patient reports that he has been doing pretty good. Went shopping Saturday. Did good with energy. Dessie has went out. Waiting for the person to come and fix it.  Does therapy every 3 weeks. Has been in therapy for approx 1 year. Cooking Thanksgiving this year. Will start making Christmas cookies the day after Thanksgiving.    Mood Really good  Medications Doing pretty good. No ill effects.  Appetite Pretty good  Sleep Wonderful  Energy Average  Stressors Furnace going out. Waiting for disability. Currently in appeal. Mom has been sick.    Problem List[1]  Past Medical History[2]  Past Surgical History[3]  Family History[4]  Social History     Socioeconomic History    Marital status: Single   Tobacco Use    Smoking status: Never    Smokeless tobacco: Never   Vaping Use    Vaping status: Never Used   Substance and Sexual Activity    Alcohol use: Not Currently    Drug use: Never     Social Determinants of Health     Social Connections: Low Risk (01/23/2023)    Social Connections     SDOH Social Isolation: 5 or more times a week      Latuda [lurasidone] and Prednisone   Current Outpatient Medications   Medication Sig    atorvastatin (LIPITOR) 10 mg Oral Tablet Take 1 Tablet (10 mg total) by mouth Daily    busPIRone  (BUSPAR ) 30 mg Oral Tablet Take 1 Tablet (30 mg total) by mouth Twice daily Indications: repeated episodes of anxiety    Cholecalciferol, Vitamin D3, (VITAMIN D) 25 mcg (1,000 unit) Oral Capsule Take 1 Capsule (1,000 Units total) by mouth Once a day    DULoxetine  (CYMBALTA  DR) 60 mg Oral Capsule, Delayed Release(E.C.) Take 2 Capsules (120 mg total) by mouth Daily Indications: anxiousness associated with depression    Garlic 1,000 mg Oral  Capsule Take 1 Capsule (1,000 mg total) by mouth Daily    hydrOXYzine  pamoate (VISTARIL ) 50 mg Oral Capsule Take 1 Capsule (50 mg total) by mouth Three times a day as needed for Anxiety for up to 30 days    lamoTRIgine  (LAMICTAL ) 200 mg Oral Tablet Take 1 Tablet (200 mg total) by mouth Twice daily Indications: bipolar depression    LORazepam  (ATIVAN ) 1 mg Oral Tablet Take 1 Tablet (1 mg total) by mouth Once per day as needed for Anxiety    metoprolol  tartrate (LOPRESSOR ) 50 mg Oral Tablet Take 1 Tablet (50 mg total) by mouth Twice daily    niacin 100 mg Oral Capsule Take 1 Capsule (100 mg total) by mouth Daily    omega-3-DHA-EPA-fish oil (FISH OIL) 1,000 (120-180) mg Oral Capsule Take 1 Capsule by mouth Daily    pregabalin  (LYRICA ) 150 mg Oral Capsule Take 1 Capsule (150 mg total) by mouth Twice daily    ziprasidone  (GEODON ) 80 mg Oral Capsule Take 1 Capsule (80 mg total) by mouth Twice daily for 60 days Indications: bipolar depression, insomnia    zolpidem  (AMBIEN ) 10 mg Oral Tablet Take 1 Tablet (10 mg total) by mouth Every night as needed for Insomnia for up to 30 days  Objective :  Resp 18   Ht 1.803 m (5' 11)   Wt (!) 137 kg (301 lb)   BMI 41.98 kg/m       PHQ Total Score                      11/16/2023     9:51 AM 12/18/2023    10:19 AM 01/15/2024     8:42 AM   Most Recent PHQ-9 Scores   PHQ 9 Total 7 13 19            Mental Status Exam  AXOX4. Casual dress, calm, well-groomed. No SI, HI, AVH, delusions, or paranoia. Thoughts are logical, coherent, and goal-directed. Good eye contact. Speech is normal in rate and tone. Mood is okay affect congruent. No psychomotor agitation or retardation, cogwheel rigidity, or abnormal movements. Gait is normal. Attention, concentration, and memory are good. No cognitive deficits noted. Judgment and insight are fair. Calculation and abstraction are within normal limits.     Data Reviewed  I have reviewed patient's previous note medical, surgical, family, and social  history in detail today,     Assessment  Bipolar II Disorder, Generalized Anxiety, and PTSD     Plan  Lamotrigine  200 mg take 1 tab by mouth twice daily for Bipolar II Disorder  Buspirone  30 mg take 1 tab by mouth twice daily for anxiety  Duloxetine  DR 120 mg by mouth daily for anxiety, depression, and PTSD  Lorazepam  1 mg take 1 tab by mouth twice daily as needed for anxiety   Ziprasidone  80 mg take 1 cap by mouth twice daily for Bipolar II Disorder  Hydroxyzine  Pamoate 50 mg take 1 cap by mouth three times daily as needed for anxiety   Zolpidem  10 mg by mouth at bedtime for insomnia   Pregabalin  150 mg take 1 cap by mouth twice daily for mood disorder      Follow up  Follow up in 4 weeks     Jon Apa, APRN, CNP        [1]   Patient Active Problem List  Diagnosis    Suicidal ideation    Bipolar II disorder, most recent episode major depressive (CMS HCC)    PTSD (post-traumatic stress disorder)    GAD (generalized anxiety disorder)    Homicidal ideations    Adult situational stress disorder    Insomnia    Autism spectrum disorder   [2]   Past Medical History:  Diagnosis Date    Anxiety     Depression     HTN (hypertension)     Other manic episodes    [3] No past surgical history on file.  [4]   Family History  Problem Relation Name Age of Onset    No Known Problems Mother      Heart Disease Father      Alcohol abuse Father      Alcohol abuse Paternal Aunt

## 2024-03-12 ENCOUNTER — Other Ambulatory Visit: Payer: Self-pay

## 2024-03-12 ENCOUNTER — Ambulatory Visit: Payer: Self-pay

## 2024-03-12 ENCOUNTER — Encounter (HOSPITAL_PSYCHIATRIC): Payer: Self-pay

## 2024-03-12 VITALS — BP 134/80 | HR 78 | Resp 18 | Ht 71.0 in | Wt 315.0 lb

## 2024-03-12 DIAGNOSIS — F840 Autistic disorder: Secondary | ICD-10-CM

## 2024-03-12 DIAGNOSIS — F4310 Post-traumatic stress disorder, unspecified: Secondary | ICD-10-CM

## 2024-03-12 DIAGNOSIS — G4700 Insomnia, unspecified: Secondary | ICD-10-CM

## 2024-03-12 DIAGNOSIS — Z79899 Other long term (current) drug therapy: Secondary | ICD-10-CM

## 2024-03-12 DIAGNOSIS — F411 Generalized anxiety disorder: Secondary | ICD-10-CM

## 2024-03-12 DIAGNOSIS — F3181 Bipolar II disorder: Secondary | ICD-10-CM

## 2024-03-12 DIAGNOSIS — F4320 Adjustment disorder, unspecified: Secondary | ICD-10-CM | POA: Insufficient documentation

## 2024-03-12 DIAGNOSIS — F4010 Social phobia, unspecified: Secondary | ICD-10-CM

## 2024-03-12 DIAGNOSIS — F410 Panic disorder [episodic paroxysmal anxiety] without agoraphobia: Secondary | ICD-10-CM

## 2024-03-12 MED ORDER — ZIPRASIDONE 80 MG CAPSULE: 80.0000 mg | ORAL_CAPSULE | Freq: Two times a day (BID) | ORAL | 1 refills | Status: DC

## 2024-03-12 MED ORDER — PREGABALIN 150 MG CAPSULE: 150.0000 mg | ORAL_CAPSULE | Freq: Two times a day (BID) | ORAL | 1 refills | Status: DC

## 2024-03-12 MED ORDER — ZOLPIDEM 10 MG TABLET: 10.0000 mg | ORAL_TABLET | Freq: Every evening | ORAL | 0 refills | Status: DC | PRN

## 2024-03-12 MED ORDER — BUSPIRONE 30 MG TABLET: 30.0000 mg | ORAL_TABLET | Freq: Two times a day (BID) | ORAL | 0 refills | Status: DC

## 2024-03-12 MED ORDER — HYDROXYZINE PAMOATE 50 MG CAPSULE: 50.0000 mg | ORAL_CAPSULE | Freq: Three times a day (TID) | ORAL | 0 refills | Status: AC | PRN

## 2024-03-12 MED ORDER — LORAZEPAM 1 MG TABLET: 1.0000 mg | ORAL_TABLET | Freq: Two times a day (BID) | ORAL | 0 refills | Status: DC | PRN

## 2024-03-12 MED ORDER — LAMOTRIGINE 200 MG TABLET: 200.0000 mg | ORAL_TABLET | Freq: Two times a day (BID) | ORAL | 0 refills | Status: DC

## 2024-03-12 MED ORDER — DULOXETINE 60 MG CAPSULE,DELAYED RELEASE: 120.0000 mg | DELAYED_RELEASE_CAPSULE | Freq: Every day | ORAL | 1 refills | Status: DC

## 2024-03-12 NOTE — Progress Notes (Signed)
 BEHAVIORAL MEDICINE, [facility]  [address]  [place] [address]  Operated by [facility]    Name: Cody Mccoy MRN:  [id number]   Date: 03/12/2024 DOB:  06/02/1980 (43 y.o.)       Chief Complaint: Bipolar Disorder    Subjective:   Patient reports things have been good and bad. Anxiety, especially in groups of people, has not been good. Went to a concert last week. Has been denied his disability and has gotten an pensions consultant. Waiting to hear something. Spoke with PCP, if insurance will approve it, will start Zepbound. Plans to spend Christmas with his parents. Will go and see maternal grandparents tomorrow and celebrate Christmas.   Does therapy every 3 weeks. Has been in therapy for approx 1 year.     Mood OK  Medications Feels that meds are doing very well. No ill effects. Feels stable right now.  Appetite A little hungry all the time.  Sleep Good. Hasn't had to take many sleeping pills until last night.  Energy Low  Stressors Disability.    Problem List[1]  Past Medical History[2]  Past Surgical History[3]  Family History[4]  Social History     Socioeconomic History    Marital status: Single   Tobacco Use    Smoking status: Never    Smokeless tobacco: Never   Vaping Use    Vaping status: Never Used   Substance and Sexual Activity    Alcohol use: Not Currently    Drug use: Never     Social Determinants of Health     Social Connections: Low Risk (01/23/2023)    Social Connections     SDOH Social Isolation: 5 or more times a week      Latuda [lurasidone] and Prednisone   Current Outpatient Medications   Medication Sig    atorvastatin (LIPITOR) 10 mg Oral Tablet Take 1 Tablet (10 mg total) by mouth Daily    busPIRone  (BUSPAR ) 30 mg Oral Tablet Take 1 Tablet (30 mg total) by mouth Twice daily Indications: repeated episodes of anxiety    Cholecalciferol, Vitamin D3, (VITAMIN D) 25 mcg (1,000 unit) Oral Capsule Take 1 Capsule (1,000 Units total) by mouth Once a day    DULoxetine   (CYMBALTA  DR) 60 mg Oral Capsule, Delayed Release(E.C.) Take 2 Capsules (120 mg total) by mouth Daily Indications: anxiousness associated with depression    Garlic 1,000 mg Oral Capsule Take 1 Capsule (1,000 mg total) by mouth Daily    hydrOXYzine  pamoate (VISTARIL ) 50 mg Oral Capsule Take 1 Capsule (50 mg total) by mouth Three times a day as needed for Anxiety for up to 30 days    lamoTRIgine  (LAMICTAL ) 200 mg Oral Tablet Take 1 Tablet (200 mg total) by mouth Twice daily Indications: bipolar depression    LORazepam  (ATIVAN ) 1 mg Oral Tablet Take 1 Tablet (1 mg total) by mouth Twice per day as needed for Anxiety    metoprolol  tartrate (LOPRESSOR ) 50 mg Oral Tablet Take 1 Tablet (50 mg total) by mouth Twice daily    niacin 100 mg Oral Capsule Take 1 Capsule (100 mg total) by mouth Daily (Patient taking differently: Take 2 Capsules (200 mg total) by mouth Daily)    omega-3-DHA-EPA-fish oil (FISH OIL) 1,000 (120-180) mg Oral Capsule Take 1 Capsule by mouth Daily    pregabalin  (LYRICA ) 150 mg Oral Capsule Take 1 Capsule (150 mg total) by mouth Twice daily    ziprasidone  (GEODON ) 80 mg Oral Capsule Take 1 Capsule (80  mg total) by mouth Twice daily for 60 days Indications: bipolar depression, insomnia    zolpidem  (AMBIEN ) 10 mg Oral Tablet Take 1 Tablet (10 mg total) by mouth Every night as needed for Insomnia for up to 30 days        Objective :  BP 134/80 (Site: Left Arm, Patient Position: Sitting)   Pulse 78   Resp 18   Ht 1.803 m (5' 11)   Wt (!) 143 kg (315 lb)   BMI 43.93 kg/m       PHQ Total Score  PHQ 2 Total: 4  PHQ 9 Total: 13  Interpretation of Total Score: 10-14 Moderate depression             01/15/2024     8:42 AM 02/12/2024     8:49 AM 03/12/2024     8:33 AM   Most Recent PHQ-9 Scores   PHQ 9 Total 19 8 13            Mental Status Exam  AXOX4. Casual dress, calm, well-groomed. No SI, HI, AVH, delusions, or paranoia. Thoughts are logical, coherent, and goal-directed. Good eye contact. Speech is normal  in rate and tone. Mood is okay affect congruent. No psychomotor agitation or retardation, cogwheel rigidity, or abnormal movements. Gait is normal. Attention, concentration, and memory are good. No cognitive deficits noted. Judgment and insight are fair. Calculation and abstraction are within normal limits.     Data Reviewed  I have reviewed patient's previous note medical, surgical, family, and social history in detail today,     Assessment  Bipolar II Disorder, Generalized Anxiety, insomnia, social anxiety, autism, and PTSD     Plan  Lamotrigine  200 mg take 1 tab by mouth twice daily for Bipolar II Disorder  Buspirone  30 mg take 1 tab by mouth twice daily for anxiety  Duloxetine  DR 120 mg by mouth daily for anxiety, depression, and PTSD  Lorazepam  1 mg take 1 tab by mouth twice daily as needed for anxiety   Ziprasidone  80 mg take 1 cap by mouth twice daily for Bipolar II Disorder  Hydroxyzine  Pamoate 50 mg take 1 cap by mouth three times daily as needed for anxiety   Zolpidem  10 mg by mouth at bedtime for insomnia   Pregabalin  150 mg take 1 cap by mouth twice daily for mood disorder      Follow up  Follow up in 4 weeks     Jon Apa, APRN, CNP        [1]   Patient Active Problem List  Diagnosis    Suicidal ideation    Bipolar II disorder, most recent episode major depressive (CMS HCC)    PTSD (post-traumatic stress disorder)    GAD (generalized anxiety disorder)    Homicidal ideations    Adult situational stress disorder    Insomnia    Autism spectrum disorder   [2]   Past Medical History:  Diagnosis Date    Anxiety     Depression     HTN (hypertension)     Other manic episodes    [3] No past surgical history on file.  [4]   Family History  Problem Relation Name Age of Onset    No Known Problems Mother      Heart Disease Father      Alcohol abuse Father      Alcohol abuse Paternal Aunt

## 2024-03-25 ENCOUNTER — Other Ambulatory Visit: Payer: Self-pay

## 2024-03-25 ENCOUNTER — Ambulatory Visit: Payer: Self-pay | Attending: Clinical | Admitting: Clinical

## 2024-03-25 DIAGNOSIS — F411 Generalized anxiety disorder: Secondary | ICD-10-CM

## 2024-03-25 DIAGNOSIS — F3181 Bipolar II disorder: Secondary | ICD-10-CM

## 2024-03-25 DIAGNOSIS — G4700 Insomnia, unspecified: Secondary | ICD-10-CM

## 2024-03-25 DIAGNOSIS — F840 Autistic disorder: Secondary | ICD-10-CM

## 2024-03-25 NOTE — Progress Notes (Signed)
 [place] [facility]    [facility]     Name: Cody Mccoy MRN:  [id number]   Date: 03/25/2024 DOB: [birth year]     Start Time: 8:05  End Time: 9:00      Diagnosis:   (G47.00) Insomnia, unspecified type  (primary encounter diagnosis)    (F84.0) Autism spectrum disorder    (F31.81) Bipolar II disorder, most recent episode major depressive (CMS HCC)    (F41.1) GAD (generalized anxiety disorder)       Mental Status Examination:  OBJECTIVE:     Mood: anxious and depressed   Affect: congruent to mood   Thought process: intrusive      Risk Assessment:     Based upon patient presentation and therapist observation during session, patient appears not to be SI/HI.  In the event of increase in SI or HI, I recommended the patient use the following resources to reduce risk of harm to self or others.  Call [phone]  Go to the nearest emergency room  Call the National Suicide Prevention Lifeline (988)  Suicide Prevention Lifeline Online Chat - dealerspiff.com.pt  Crisis Call: 580-193-1967 or (304) 325-HOPE    [x]  Client denies all areas of risk. No contrary clinical indications present.   Area of Risk:     Level of Risk:      Intent to Act:    Plan to Act:    Means to Act:  [x]  Low      []  Yes     []  Yes     []  Yes  []  Medium     []  No     []  No     []  No  []  High     [x]  Not Applicable   [x]  Not Applicable   [x]  Not Applicable  []  Imminent    Risk Factors:   Protective Factors:     Current Outpatient Medications   Medication Sig    atorvastatin (LIPITOR) 10 mg Oral Tablet Take 1 Tablet (10 mg total) by mouth Daily    busPIRone  (BUSPAR ) 30 mg Oral Tablet Take 1 Tablet (30 mg total) by mouth Twice daily Indications: repeated episodes of anxiety    Cholecalciferol, Vitamin D3, (VITAMIN D) 25 mcg (1,000 unit) Oral Capsule Take 1 Capsule (1,000 Units total) by mouth Once a day    DULoxetine  (CYMBALTA  DR) 60 mg Oral Capsule, Delayed Release(E.C.) Take 2  Capsules (120 mg total) by mouth Daily Indications: anxiousness associated with depression    Garlic 1,000 mg Oral Capsule Take 1 Capsule (1,000 mg total) by mouth Daily    hydrOXYzine  pamoate (VISTARIL ) 50 mg Oral Capsule Take 1 Capsule (50 mg total) by mouth Three times a day as needed for Anxiety for up to 30 days    lamoTRIgine  (LAMICTAL ) 200 mg Oral Tablet Take 1 Tablet (200 mg total) by mouth Twice daily Indications: bipolar depression    LORazepam  (ATIVAN ) 1 mg Oral Tablet Take 1 Tablet (1 mg total) by mouth Twice per day as needed for Anxiety    metoprolol  tartrate (LOPRESSOR ) 50 mg Oral Tablet Take 1 Tablet (50 mg total) by mouth Twice daily    niacin 100 mg Oral Capsule Take 1 Capsule (100 mg total) by mouth Daily (Patient taking differently: Take 2 Capsules (200 mg total) by mouth Daily)    omega-3-DHA-EPA-fish oil (FISH OIL) 1,000 (120-180) mg Oral Capsule Take 1 Capsule by mouth Daily    pregabalin  (LYRICA ) 150 mg Oral  Capsule Take 1 Capsule (150 mg total) by mouth Twice daily    ziprasidone  (GEODON ) 80 mg Oral Capsule Take 1 Capsule (80 mg total) by mouth Twice daily for 60 days Indications: bipolar depression, insomnia    zolpidem  (AMBIEN ) 10 mg Oral Tablet Take 1 Tablet (10 mg total) by mouth Every night as needed for Insomnia for up to 30 days              SUBJECTIVE:     Cody Mccoy presents today for an individual therapy session to continue addressing ongoing struggles related to anxiety and depressive symptoms.  Patient reports being irritable towards his dad due to feeling like he is controlling at times and will not let patient drive himself to locations that he wants to go to.  Patient spent the time discussing various topics including goals for the new year and plans over the next week.  Patient is hopeful to start a weight loss medication and wants to go to the gym more to get into the pool and start lifting weights in order to help his back strengthen.      INTERVENTION/PLAN:  Therapist  validated Cody Mccoy's feelings while providing positive feedback. Counselor will continue to work with Cody Mccoy on developing appropriate coping skills and processing stressors. Supportive psychotherapyand CBT skills were utilized to encourage identification and expression of present feeling states, validate expressed feelings, and encourage self-efficacy. Therapeutic skills used: developing and using coping skills Cody Mccoy will continue to be encouraged to maintain regular attendance to scheduled appointments, identify triggers for emotional and/or behavioral relapse, and develop effective coping strategies. Cody Mccoy will return to clinic in 1 month or sooner if needed.         [address] LICSW, 03/25/2024, 10:50

## 2024-04-12 ENCOUNTER — Ambulatory Visit: Payer: Self-pay

## 2024-04-15 ENCOUNTER — Encounter (HOSPITAL_PSYCHIATRIC): Payer: Self-pay

## 2024-04-15 ENCOUNTER — Ambulatory Visit

## 2024-04-15 ENCOUNTER — Ambulatory Visit (HOSPITAL_PSYCHIATRIC): Payer: Self-pay | Admitting: Clinical

## 2024-04-15 ENCOUNTER — Other Ambulatory Visit: Payer: Self-pay

## 2024-04-15 VITALS — BP 126/68 | HR 81 | Resp 18 | Ht 71.0 in | Wt 315.0 lb

## 2024-04-15 DIAGNOSIS — G4700 Insomnia, unspecified: Secondary | ICD-10-CM | POA: Insufficient documentation

## 2024-04-15 DIAGNOSIS — F840 Autistic disorder: Secondary | ICD-10-CM | POA: Insufficient documentation

## 2024-04-15 DIAGNOSIS — F3181 Bipolar II disorder: Secondary | ICD-10-CM | POA: Insufficient documentation

## 2024-04-15 DIAGNOSIS — F418 Other specified anxiety disorders: Secondary | ICD-10-CM

## 2024-04-15 DIAGNOSIS — F4310 Post-traumatic stress disorder, unspecified: Secondary | ICD-10-CM | POA: Insufficient documentation

## 2024-04-15 DIAGNOSIS — F411 Generalized anxiety disorder: Secondary | ICD-10-CM | POA: Insufficient documentation

## 2024-04-15 MED ORDER — DULOXETINE 60 MG CAPSULE,DELAYED RELEASE: 60.0000 mg | DELAYED_RELEASE_CAPSULE | Freq: Two times a day (BID) | ORAL | 1 refills | Status: AC

## 2024-04-15 MED ORDER — BUSPIRONE 30 MG TABLET: 30.0000 mg | ORAL_TABLET | Freq: Two times a day (BID) | ORAL | 0 refills | Status: AC

## 2024-04-15 MED ORDER — PREGABALIN 150 MG CAPSULE: 150.0000 mg | ORAL_CAPSULE | Freq: Two times a day (BID) | ORAL | 1 refills | Status: AC

## 2024-04-15 MED ORDER — ZOLPIDEM 10 MG TABLET: 10.0000 mg | ORAL_TABLET | Freq: Every evening | ORAL | 0 refills | Status: AC | PRN

## 2024-04-15 MED ORDER — LAMOTRIGINE 200 MG TABLET: 200.0000 mg | ORAL_TABLET | Freq: Two times a day (BID) | ORAL | 0 refills | Status: AC

## 2024-04-15 MED ORDER — ZIPRASIDONE 80 MG CAPSULE: 80.0000 mg | ORAL_CAPSULE | Freq: Two times a day (BID) | ORAL | 1 refills | Status: AC

## 2024-04-15 MED ORDER — LORAZEPAM 1 MG TABLET: 1.0000 mg | ORAL_TABLET | Freq: Two times a day (BID) | ORAL | 0 refills | Status: AC | PRN

## 2024-04-15 NOTE — Progress Notes (Signed)
 [facility]  [address]  [place] [address]  Operated by [facility]    Name: Cody Mccoy MRN:  [id number]   Date: 04/15/2024 DOB:  09/06/1980 (44 y.o.)       Chief Complaint: Bipolar Disorder, PTSD, Generalized Anxiety, and Autism    Subjective:   Patient reports that he was approved for Zepbound after he obtains a sleep study that proves that he has sleep apnea. Patient reports that his depression has worsened over the last several weeks. Dad has noticed the increase in depression.  Does therapy every 3 weeks. Has been in therapy for approx 1 year.     Mood Depressed  Medications Increased depression.  Appetite Awful. Eating too much. Has lost 11 pounds doing a low carb diet.  Sleep Great  Energy Down  Stressors Denies    Problem List[1]  Past Medical History[2]  Past Surgical History[3]  Family History[4]  Social History     Socioeconomic History    Marital status: Single   Tobacco Use    Smoking status: Never    Smokeless tobacco: Never   Vaping Use    Vaping status: Never Used   Substance and Sexual Activity    Alcohol use: Not Currently    Drug use: Never     Social Determinants of Health     Social Connections: Low Risk (01/23/2023)    Social Connections     SDOH Social Isolation: 5 or more times a week      Latuda [lurasidone] and Prednisone   Current Outpatient Medications   Medication Sig    atorvastatin (LIPITOR) 10 mg Oral Tablet Take 1 Tablet (10 mg total) by mouth Daily    busPIRone  (BUSPAR ) 30 mg Oral Tablet Take 1 Tablet (30 mg total) by mouth Twice daily Indications: repeated episodes of anxiety    Cholecalciferol, Vitamin D3, (VITAMIN D) 25 mcg (1,000 unit) Oral Capsule Take 1 Capsule (1,000 Units total) by mouth Once a day    DULoxetine  (CYMBALTA  DR) 60 mg Oral Capsule, Delayed Release(E.C.) Take 2 Capsules (120 mg total) by mouth Daily Indications: anxiousness associated with depression    Garlic 1,000 mg Oral Capsule Take 1 Capsule  (1,000 mg total) by mouth Daily    hydrOXYzine  pamoate (VISTARIL ) 50 mg Oral Capsule Take 1 Capsule (50 mg total) by mouth Three times a day as needed for Anxiety for up to 30 days    lamoTRIgine  (LAMICTAL ) 200 mg Oral Tablet Take 1 Tablet (200 mg total) by mouth Twice daily Indications: bipolar depression    LORazepam  (ATIVAN ) 1 mg Oral Tablet Take 1 Tablet (1 mg total) by mouth Twice per day as needed for Anxiety    metoprolol  tartrate (LOPRESSOR ) 50 mg Oral Tablet Take 1 Tablet (50 mg total) by mouth Twice daily    niacin 100 mg Oral Capsule Take 1 Capsule (100 mg total) by mouth Daily (Patient taking differently: Take 2 Capsules (200 mg total) by mouth Daily)    omega-3-DHA-EPA-fish oil (FISH OIL) 1,000 (120-180) mg Oral Capsule Take 1 Capsule by mouth Daily    pregabalin  (LYRICA ) 150 mg Oral Capsule Take 1 Capsule (150 mg total) by mouth Twice daily    ziprasidone  (GEODON ) 80 mg Oral Capsule Take 1 Capsule (80 mg total) by mouth Twice daily for 60 days Indications: bipolar depression, insomnia    zolpidem  (AMBIEN ) 10 mg Oral Tablet Take 1 Tablet (10 mg total) by mouth Every night as needed for Insomnia for  up to 30 days        Objective :  BP 126/68 (Site: Left Arm, Patient Position: Sitting)   Pulse 81   Resp 18   Ht 1.803 m (5' 11)   Wt (!) 143 kg (315 lb)   BMI 43.93 kg/m       PHQ Total Score  PHQ 2 Total: 6  PHQ 9 Total: 25  Interpretation of Total Score: 20-27 Severe depression             02/12/2024     8:49 AM 03/12/2024     8:33 AM 04/15/2024     8:42 AM   Most Recent PHQ-9 Scores   PHQ 9 Total 8 13 25            Mental Status Exam  AXOX4. Casual dress, calm, well-groomed. No SI, HI, AVH, delusions, or paranoia. Thoughts are logical, coherent, and goal-directed. Good eye contact. Speech is normal in rate and tone. Mood is okay affect congruent. No psychomotor agitation or retardation, cogwheel rigidity, or abnormal movements. Gait is normal. Attention, concentration, and memory are good. No  cognitive deficits noted. Judgment and insight are fair. Calculation and abstraction are within normal limits.     Data Reviewed  I have reviewed patient's previous note medical, surgical, family, and social history in detail today,     Assessment  Bipolar II Disorder, Generalized Anxiety, insomnia, social anxiety, autism, and PTSD     Plan  Lamotrigine  200 mg take 1 tab by mouth twice daily for Bipolar II Disorder  Buspirone  30 mg take 1 tab by mouth twice daily for anxiety  Change Duloxetine  DR 120 mg by mouth from daily to 60 mg twice daily for anxiety, depression, and PTSD  Lorazepam  1 mg take 1 tab by mouth twice daily as needed for anxiety   Ziprasidone  80 mg take 1 cap by mouth twice daily for Bipolar II Disorder  Hydroxyzine  Pamoate 50 mg take 1 cap by mouth three times daily as needed for anxiety   Zolpidem  10 mg by mouth at bedtime for insomnia   Pregabalin  150 mg take 1 cap by mouth twice daily for mood disorder      Follow up  Follow up in 4 weeks     Jon Apa, APRN, CNP        [1]   Patient Active Problem List  Diagnosis    Suicidal ideation    Bipolar II disorder, most recent episode major depressive (CMS HCC)    PTSD (post-traumatic stress disorder)    GAD (generalized anxiety disorder)    Homicidal ideations    Adult situational stress disorder    Insomnia    Autism spectrum disorder   [2]   Past Medical History:  Diagnosis Date    Anxiety     Depression     HTN (hypertension)     Other manic episodes    [3] No past surgical history on file.  [4]   Family History  Problem Relation Name Age of Onset    No Known Problems Mother      Heart Disease Father      Alcohol abuse Father      Alcohol abuse Paternal Aunt

## 2024-05-06 ENCOUNTER — Ambulatory Visit (HOSPITAL_PSYCHIATRIC): Payer: Self-pay | Admitting: Clinical

## 2024-05-06 ENCOUNTER — Ambulatory Visit: Payer: Self-pay

## 2024-06-03 ENCOUNTER — Ambulatory Visit (HOSPITAL_PSYCHIATRIC): Payer: Self-pay | Admitting: Clinical

## 2024-07-01 ENCOUNTER — Ambulatory Visit (HOSPITAL_PSYCHIATRIC): Payer: Self-pay | Admitting: Clinical
